# Patient Record
Sex: Male | Born: 1985 | Race: White | Hispanic: No | Marital: Married | Smoking: Never smoker
Health system: Southern US, Community
[De-identification: ages and names within clinical notes are randomized; demographics above are authoritative.]

## PROBLEM LIST (undated history)

## (undated) DIAGNOSIS — T7840XA Allergy, unspecified, initial encounter: Secondary | ICD-10-CM

## (undated) DIAGNOSIS — R011 Cardiac murmur, unspecified: Secondary | ICD-10-CM

## (undated) DIAGNOSIS — J302 Other seasonal allergic rhinitis: Secondary | ICD-10-CM

## (undated) DIAGNOSIS — R079 Chest pain, unspecified: Secondary | ICD-10-CM

## (undated) DIAGNOSIS — L309 Dermatitis, unspecified: Secondary | ICD-10-CM

## (undated) HISTORY — DX: Allergy, unspecified, initial encounter: T78.40XA

## (undated) HISTORY — DX: Other seasonal allergic rhinitis: J30.2

## (undated) HISTORY — DX: Cardiac murmur, unspecified: R01.1

## (undated) HISTORY — DX: Dermatitis, unspecified: L30.9

## (undated) HISTORY — PX: HYDROCELE EXCISION / REPAIR: SUR1145

## (undated) HISTORY — PX: WISDOM TOOTH EXTRACTION: SHX21

## (undated) HISTORY — DX: Chest pain, unspecified: R07.9

## (undated) HISTORY — PX: HAND SURGERY: SHX662

---
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
## 2012-10-15 ENCOUNTER — Encounter (HOSPITAL_COMMUNITY): Payer: Self-pay | Admitting: *Deleted

## 2012-10-15 ENCOUNTER — Emergency Department (HOSPITAL_COMMUNITY): Payer: 59

## 2012-10-15 ENCOUNTER — Emergency Department (HOSPITAL_COMMUNITY)
Admission: EM | Admit: 2012-10-15 | Discharge: 2012-10-15 | Disposition: A | Discharge status: Home / Self Care | Payer: 59 | Attending: Emergency Medicine | Admitting: Emergency Medicine

## 2012-10-15 DIAGNOSIS — R0789 Other chest pain: Secondary | ICD-10-CM

## 2012-10-15 DIAGNOSIS — S99929A Unspecified injury of unspecified foot, initial encounter: Secondary | ICD-10-CM | POA: Insufficient documentation

## 2012-10-15 DIAGNOSIS — S8990XA Unspecified injury of unspecified lower leg, initial encounter: Secondary | ICD-10-CM | POA: Insufficient documentation

## 2012-10-15 DIAGNOSIS — Y9389 Activity, other specified: Secondary | ICD-10-CM | POA: Insufficient documentation

## 2012-10-15 DIAGNOSIS — M79672 Pain in left foot: Secondary | ICD-10-CM

## 2012-10-15 DIAGNOSIS — S298XXA Other specified injuries of thorax, initial encounter: Secondary | ICD-10-CM | POA: Insufficient documentation

## 2012-10-15 DIAGNOSIS — Y92410 Unspecified street and highway as the place of occurrence of the external cause: Secondary | ICD-10-CM | POA: Insufficient documentation

## 2012-10-15 MED ORDER — CYCLOBENZAPRINE HCL 10 MG PO TABS: 10.0000 mg | ORAL_TABLET | Freq: Two times a day (BID) | ORAL | Status: DC | PRN

## 2012-10-15 MED ORDER — NAPROXEN 500 MG PO TABS: 500.0000 mg | ORAL_TABLET | Freq: Two times a day (BID) | ORAL | Status: DC
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
## 2012-10-21 ENCOUNTER — Telehealth: Payer: Self-pay | Admitting: Nurse Practitioner

## 2012-10-21 NOTE — Telephone Encounter (Signed)
APPT MADE

## 2012-10-21 NOTE — Telephone Encounter (Signed)
wtbs tomorrow

## 2012-10-23 ENCOUNTER — Encounter: Payer: Self-pay | Admitting: General Practice

## 2012-10-23 ENCOUNTER — Ambulatory Visit: Payer: 59 | Admitting: General Practice

## 2012-10-23 VITALS — BP 115/70 | HR 80 | Temp 97.1°F | Ht 72.0 in | Wt 167.0 lb

## 2012-10-23 DIAGNOSIS — Z7689 Persons encountering health services in other specified circumstances: Secondary | ICD-10-CM

## 2012-10-23 DIAGNOSIS — Z0189 Encounter for other specified special examinations: Secondary | ICD-10-CM
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
## 2013-04-10 ENCOUNTER — Ambulatory Visit (INDEPENDENT_AMBULATORY_CARE_PROVIDER_SITE_OTHER): Payer: 59 | Admitting: Nurse Practitioner

## 2013-04-10 ENCOUNTER — Encounter: Payer: Self-pay | Admitting: Nurse Practitioner

## 2013-04-10 ENCOUNTER — Telehealth: Payer: Self-pay | Admitting: Family Medicine

## 2013-04-10 VITALS — BP 113/67 | HR 75 | Temp 98.8°F | Ht 71.0 in | Wt 172.0 lb

## 2013-04-10 DIAGNOSIS — R109 Unspecified abdominal pain: Secondary | ICD-10-CM

## 2013-04-10 DIAGNOSIS — N390 Urinary tract infection, site not specified: Secondary | ICD-10-CM

## 2013-04-10 DIAGNOSIS — R42 Dizziness and giddiness: Secondary | ICD-10-CM

## 2013-04-10 LAB — POCT UA - MICROSCOPIC ONLY

## 2013-04-10 LAB — POCT URINALYSIS DIPSTICK

## 2013-04-10 MED ORDER — CIPROFLOXACIN HCL 500 MG PO TABS: 500.0000 mg | ORAL_TABLET | Freq: Two times a day (BID) | ORAL | Status: DC

## 2013-04-10 MED ORDER — MECLIZINE HCL 25 MG PO TABS: 25.0000 mg | ORAL_TABLET | Freq: Three times a day (TID) | ORAL | Status: DC | PRN

## 2013-04-10 NOTE — Patient Instructions (Signed)
Urinary Tract Infection  Urinary tract infections (UTIs) can develop anywhere along your urinary tract. Your urinary tract is your body's drainage system for removing wastes and extra water. Your urinary tract includes two kidneys, two ureters, a bladder, and a urethra. Your kidneys are a pair of bean-shaped organs. Each kidney is about the size of your fist. They are located below your ribs, one on each side of your spine.  CAUSES  Infections are caused by microbes, which are microscopic organisms, including fungi, viruses, and bacteria. These organisms are so small that they can only be seen through a microscope. Bacteria are the microbes that most commonly cause UTIs.  SYMPTOMS   Symptoms of UTIs may vary by age and gender of the patient and by the location of the infection. Symptoms in young women typically include a frequent and intense urge to urinate and a painful, burning feeling in the bladder or urethra during urination. Older women and men are more likely to be tired, shaky, and weak and have muscle aches and abdominal pain. A fever may mean the infection is in your kidneys. Other symptoms of a kidney infection include pain in your back or sides below the ribs, nausea, and vomiting.  DIAGNOSIS  To diagnose a UTI, your caregiver will ask you about your symptoms. Your caregiver also will ask to provide a urine sample. The urine sample will be tested for bacteria and white blood cells. White blood cells are made by your body to help fight infection.  TREATMENT   Typically, UTIs can be treated with medication. Because most UTIs are caused by a bacterial infection, they usually can be treated with the use of antibiotics. The choice of antibiotic and length of treatment depend on your symptoms and the type of bacteria causing your infection.  HOME CARE INSTRUCTIONS   If you were prescribed antibiotics, take them exactly as your caregiver instructs you. Finish the medication even if you feel better after you  have only taken some of the medication.   Drink enough water and fluids to keep your urine clear or pale yellow.   Avoid caffeine, tea, and carbonated beverages. They tend to irritate your bladder.   Empty your bladder often. Avoid holding urine for long periods of time.   Empty your bladder before and after sexual intercourse.   After a bowel movement, women should cleanse from front to back. Use each tissue only once.  SEEK MEDICAL CARE IF:    You have back pain.   You develop a fever.   Your symptoms do not begin to resolve within 3 days.  SEEK IMMEDIATE MEDICAL CARE IF:    You have severe back pain or lower abdominal pain.   You develop chills.   You have nausea or vomiting.   You have continued burning or discomfort with urination.  MAKE SURE YOU:    Understand these instructions.   Will watch your condition.   Will get help right away if you are not doing well or get worse.  Document Released: 04/26/2005 Document Revised: 01/16/2012 Document Reviewed: 08/25/2011  [employer] Patient Information 2014 [employer].

## 2013-04-10 NOTE — Progress Notes (Addendum)
  Subjective:    Patient ID: Nicholas Orozco, male    DOB: 05/11/1986, 27 y.o.   MRN: [id number]  HPI1. Patient has a long history of kidney stones- Has had over 30 in the past several years- He started having back pain 3 days ago- pain not the same as his kidney stone pain- He does have dysuria and frequency and voiding only small amounts at a time. Started takingAZO OTC which has helped some with symptoms. 2. vERTIGO- INTERMITTENT FOR 3-4 MONTHS- worse when he is out in  Public- antivert helps   Review of Systems  Constitutional: Positive for fever (low grade last night) and chills (last night only).  Respiratory: Negative.   Cardiovascular: Negative.   Genitourinary: Positive for dysuria, urgency, frequency and difficulty urinating.       Objective:   Physical Exam  Constitutional: He appears well-developed and well-nourished.  Cardiovascular: Normal rate, regular rhythm and normal heart sounds.   Pulmonary/Chest: Effort normal and breath sounds normal.  Abdominal: Soft. Bowel sounds are normal. There is tenderness (suprapubic).  Genitourinary:  Mild left CVA tnederness  Neurological: He has normal reflexes. No cranial nerve deficit.   BP 113/67  Pulse 75  Temp(Src) 98.8 F (37.1 C) (Oral)  Ht 5\' 11"  (1.803 m)  Wt 172 lb (78.019 kg)  BMI 24 kg/m2  Results for orders placed in visit on 04/10/13  POCT UA - MICROSCOPIC ONLY      Result Value Range   WBC, Ur, HPF, POC occ     RBC, urine, microscopic 2-3     Bacteria, U Microscopic occ     Mucus, UA large     Epithelial cells, urine per micros occ     Crystals, Ur, HPF, POC rare     Casts, Ur, LPF, POC few     Yeast, UA neg    POCT URINALYSIS DIPSTICK      Result Value Range   Color, UA orange     Clarity, UA       Glucose, UA       Bilirubin, UA       Ketones, UA       Spec Grav, UA       Blood, UA       pH, UA       Protein, UA       Urobilinogen, UA       Nitrite, UA       Leukocytes, UA              Assessment & Plan:  1. Abdominal  pain, other specified site  - POCT UA - Microscopic Only - POCT urinalysis dipstick  2. UTI (urinary tract infection) Force fluids - ciprofloxacin (CIPRO) 500 MG tablet; Take 1 tablet (500 mg total) by mouth 2 (two) times daily.  Dispense: 20 tablet; Refill: 0  3. Vertigo Keep diary of episodes - meclizine (ANTIVERT) 25 MG tablet; Take 1 tablet (25 mg total) by mouth 3 (three) times daily as needed.  Dispense: 30 tablet; Refill: 1  Mary-Margaret Daphine Deutscher, FNP

## 2013-04-10 NOTE — Telephone Encounter (Signed)
APPT MADE

## 2013-09-04 ENCOUNTER — Other Ambulatory Visit: Payer: Self-pay | Admitting: Nurse Practitioner

## 2013-09-04 MED ORDER — OSELTAMIVIR PHOSPHATE 75 MG PO CAPS: 75.0000 mg | ORAL_CAPSULE | Freq: Every day | ORAL | Status: DC
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
## 2014-10-29 ENCOUNTER — Telehealth: Payer: Self-pay | Admitting: Nurse Practitioner

## 2014-10-29 NOTE — Telephone Encounter (Signed)
Advised pt would need to be seen by a provider since he hasn't been seen since 2014, pt will CB to schedule appt for CPE.

## 2015-01-11 ENCOUNTER — Encounter: Payer: Self-pay | Admitting: Family Medicine

## 2015-01-11 ENCOUNTER — Ambulatory Visit (INDEPENDENT_AMBULATORY_CARE_PROVIDER_SITE_OTHER): Payer: 59 | Admitting: Family Medicine

## 2015-01-11 VITALS — BP 108/70 | HR 78 | Temp 98.7°F | Ht 71.0 in | Wt 174.4 lb

## 2015-01-11 DIAGNOSIS — I889 Nonspecific lymphadenitis, unspecified: Secondary | ICD-10-CM | POA: Diagnosis not present

## 2015-01-11 LAB — POCT CBC
Granulocyte percent: 61.4 %G (ref 37–80)
HCT, POC: 41.3 % — AB (ref 43.5–53.7)
Hemoglobin: 13.7 g/dL — AB (ref 14.1–18.1)
LYMPH, POC: 2.1 (ref 0.6–3.4)
MCH: 30.2 pg (ref 27–31.2)
MCHC: 33.2 g/dL (ref 31.8–35.4)
MCV: 90.9 fL (ref 80–97)
MPV: 8.2 fL (ref 0–99.8)
PLATELET COUNT, POC: 202 10*3/uL (ref 142–424)
POC Granulocyte: 4.3 (ref 2–6.9)
POC LYMPH %: 29.6 % (ref 10–50)
RBC: 4.54 M/uL — AB (ref 4.69–6.13)
RDW, POC: 12.5 %
WBC: 7 10*3/uL (ref 4.6–10.2)

## 2015-01-11 MED ORDER — AMOXICILLIN-POT CLAVULANATE 875-125 MG PO TABS: 1.0000 | ORAL_TABLET | Freq: Two times a day (BID) | ORAL | Status: DC
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
## 2015-01-12 LAB — LYME AB/WESTERN BLOT REFLEX
LYME DISEASE AB, QUANT, IGM: <0.8 index (ref 0.00–0.79)
Lyme IgG/IgM Ab: <0.91 {ISR} (ref 0.00–0.90)

## 2015-01-13 ENCOUNTER — Telehealth: Payer: Self-pay | Admitting: Family Medicine

## 2015-01-13 ENCOUNTER — Telehealth: Payer: Self-pay | Admitting: *Deleted

## 2015-01-13 NOTE — Telephone Encounter (Signed)
Pt notified of results

## 2015-01-13 NOTE — Progress Notes (Signed)
Patient aware.

## 2015-01-13 NOTE — Telephone Encounter (Signed)
-----   Message from Mechele Claude, MD sent at 01/13/2015  1:09 PM EDT ----- Perrin Maltese,    Your Lyme's Disease lab result is normal. Best regards, Mechele Claude, M.D.

## 2015-01-26 ENCOUNTER — Telehealth: Payer: Self-pay | Admitting: Nurse Practitioner

## 2015-01-26 NOTE — Telephone Encounter (Signed)
appt scheduled with MMM

## 2015-01-28 ENCOUNTER — Ambulatory Visit (INDEPENDENT_AMBULATORY_CARE_PROVIDER_SITE_OTHER): Payer: 59 | Admitting: Nurse Practitioner

## 2015-01-28 ENCOUNTER — Encounter: Payer: Self-pay | Admitting: Nurse Practitioner

## 2015-01-28 VITALS — BP 115/73 | HR 70 | Temp 97.3°F | Ht 71.0 in | Wt 176.0 lb

## 2015-01-28 DIAGNOSIS — R5383 Other fatigue: Secondary | ICD-10-CM

## 2015-01-28 DIAGNOSIS — R591 Generalized enlarged lymph nodes: Secondary | ICD-10-CM | POA: Diagnosis not present

## 2015-01-28 DIAGNOSIS — R42 Dizziness and giddiness: Secondary | ICD-10-CM

## 2015-01-28 NOTE — Patient Instructions (Signed)

## 2015-01-28 NOTE — Progress Notes (Signed)
   Subjective:    Patient ID: Nicholas Orozco, male    DOB: 01-Mar-1986, 29 y.o.   MRN: [id number]  HPI Patient here today with his mom. He is c/o headache- was seen 2 weeks ago by Dr. Livia Snellen with inflammed lymph nodes in neck- they were painful to touch. Dr. Livia Snellen refused to check him for mono-lyme titer was negative- He was put on doxycycline which helped but still has lymph  Node papable in left axillia that is painful to touch. Mom says that he had a dizy spell at work Monday- has had in the past but mainly in the summer time. Dizzy spell lasted 2 hours, became nauseaous and felt like he was going to pass out. He checked his blood pressure 124/72 and blood sugar was 95.     Review of Systems  Constitutional: Negative.   HENT: Negative.   Respiratory: Negative.   Cardiovascular: Negative.   Genitourinary: Negative.   Neurological: Negative.   Psychiatric/Behavioral: Negative.   All other systems reviewed and are negative.      Objective:   Physical Exam  Constitutional: He is oriented to person, place, and time. He appears well-developed and well-nourished.  HENT:  Right Ear: External ear normal.  Left Ear: External ear normal.  Nose: Nose normal.  Mouth/Throat: Oropharynx is clear and moist.  Neck: Normal range of motion.  Cardiovascular: Normal rate, regular rhythm and normal heart sounds.   Pulmonary/Chest: Effort normal and breath sounds normal.  Lymphadenopathy:    He has cervical adenopathy (shotty nodes anterior cervical).  Neurological: He is alert and oriented to person, place, and time.  Skin: Skin is warm and dry.  Psychiatric: He has a normal mood and affect. His behavior is normal. Judgment and thought content normal.    BP 115/73 mmHg  Pulse 70  Temp(Src) 97.3 F (36.3 C) (Oral)  Ht _0  (1.803 m)  Wt 176 lb (79.833 kg)  BMI 24.56 kg/m2       Assessment & Plan:   1. Dizziness   2. Lymphadenopathy   3. Other fatigue    Orders Placed This  Encounter  Procedures  . CMP14+EGFR    Standing Status: Future     Number of Occurrences:      Standing Expiration Date: 01/28/2016  . Anemia Profile B    Standing Status: Future     Number of Occurrences:      Standing Expiration Date: 01/28/2016  . [place] spotted fvr abs pnl(IgG+IgM)    Standing Status: Future     Number of Occurrences:      Standing Expiration Date: 01/28/2016  . Vitamin B12    Standing Status: Future     Number of Occurrences:      Standing Expiration Date: 01/28/2016  . Testosterone,Free and Total    Standing Status: Future     Number of Occurrences:      Standing Expiration Date: 01/28/2016   Keep diary of episodes Force fluids Rest will wait in labs  [place], FNP

## 2015-01-29 ENCOUNTER — Other Ambulatory Visit (INDEPENDENT_AMBULATORY_CARE_PROVIDER_SITE_OTHER): Payer: 59

## 2015-01-29 DIAGNOSIS — R5383 Other fatigue: Secondary | ICD-10-CM

## 2015-01-29 NOTE — Progress Notes (Signed)
Lab only 

## 2015-02-03 ENCOUNTER — Other Ambulatory Visit: Payer: Self-pay | Admitting: Family

## 2015-02-03 LAB — ANEMIA PROFILE B
BASOS: 2 %
Basophils Absolute: 0.1 10*3/uL (ref 0.0–0.2)
EOS (ABSOLUTE): 0.2 10*3/uL (ref 0.0–0.4)
EOS: 3 %
FERRITIN: 156 ng/mL (ref 30–400)
FOLATE: 7.1 ng/mL (ref >3.0)
Hematocrit: 41.1 % (ref 37.5–51.0)
Hemoglobin: 14 g/dL (ref 12.6–17.7)
IMMATURE GRANULOCYTES: 0 %
IRON SATURATION: 34 % (ref 15–55)
IRON: 99 ug/dL (ref 38–169)
Immature Grans (Abs): 0 10*3/uL (ref 0.0–0.1)
Lymphocytes Absolute: 2.3 10*3/uL (ref 0.7–3.1)
Lymphs: 35 %
MCH: 31.3 pg (ref 26.6–33.0)
MCHC: 34.1 g/dL (ref 31.5–35.7)
MCV: 92 fL (ref 79–97)
MONOCYTES: 10 %
MONOS ABS: 0.7 10*3/uL (ref 0.1–0.9)
NEUTROS PCT: 50 %
Neutrophils Absolute: 3.5 10*3/uL (ref 1.4–7.0)
Platelets: 209 10*3/uL (ref 150–379)
RBC: 4.48 x10E6/uL (ref 4.14–5.80)
RDW: 13.5 % (ref 12.3–15.4)
Retic Ct Pct: 1 % (ref 0.6–2.6)
Total Iron Binding Capacity: 290 ug/dL (ref 250–450)
UIBC: 191 ug/dL (ref 111–343)
VITAMIN B 12: 338 pg/mL (ref 211–946)
WBC: 6.8 10*3/uL (ref 3.4–10.8)

## 2015-02-03 LAB — CMP14+EGFR
ALBUMIN: 4.6 g/dL (ref 3.5–5.5)
ALK PHOS: 54 IU/L (ref 39–117)
ALT: 19 IU/L (ref 0–44)
AST: 18 IU/L (ref 0–40)
Albumin/Globulin Ratio: 2.2 (ref 1.1–2.5)
BILIRUBIN TOTAL: 0.7 mg/dL (ref 0.0–1.2)
BUN / CREAT RATIO: 18 (ref 8–19)
BUN: 18 mg/dL (ref 6–20)
CO2: 26 mmol/L (ref 18–29)
CREATININE: 0.98 mg/dL (ref 0.76–1.27)
Calcium: 9.7 mg/dL (ref 8.7–10.2)
Chloride: 100 mmol/L (ref 97–108)
GFR, EST AFRICAN AMERICAN: 121 mL/min/{1.73_m2} (ref >59)
GFR, EST NON AFRICAN AMERICAN: 104 mL/min/{1.73_m2} (ref >59)
GLOBULIN, TOTAL: 2.1 g/dL (ref 1.5–4.5)
Glucose: 92 mg/dL (ref 65–99)
Potassium: 4.1 mmol/L (ref 3.5–5.2)
SODIUM: 142 mmol/L (ref 134–144)
Total Protein: 6.7 g/dL (ref 6.0–8.5)

## 2015-02-03 LAB — ROCKY MTN SPOTTED FVR ABS PNL(IGG+IGM)
RMSF IgG: NEGATIVE
RMSF IgM: 0.84 index (ref 0.00–0.89)

## 2015-02-03 LAB — TESTOSTERONE,FREE AND TOTAL
TESTOSTERONE FREE: 11.2 pg/mL (ref 9.3–26.5)
Testosterone: 333 ng/dL — ABNORMAL LOW (ref 348–1197)

## 2015-02-03 MED ORDER — TESTOSTERONE 50 MG/5GM (1%) TD GEL: 5.0000 g | Freq: Every day | TRANSDERMAL | Status: DC

## 2015-02-05 ENCOUNTER — Telehealth: Payer: Self-pay | Admitting: Nurse Practitioner

## 2015-02-05 NOTE — Telephone Encounter (Signed)
Patient has a new born at home and was reading up on the androgel and it states that he can not have any contact with child for a certain amount of time after using the androgel and he is just unable to do that at this time. He wants to know what else he can do?

## 2015-02-05 NOTE — Telephone Encounter (Signed)
Only option is testosterone shots- but will have to come here to get

## 2015-02-05 NOTE — Telephone Encounter (Signed)
Spoke with patient concerning injections but phone disconnected.  I called back to let him know a script would need to be sent in and an appointment would need to be scheduled for injection at our office if he agrees.

## 2015-02-23 ENCOUNTER — Other Ambulatory Visit: Payer: Self-pay | Admitting: Nurse Practitioner

## 2015-02-23 DIAGNOSIS — E291 Testicular hypofunction: Secondary | ICD-10-CM

## 2015-03-16 ENCOUNTER — Other Ambulatory Visit (INDEPENDENT_AMBULATORY_CARE_PROVIDER_SITE_OTHER): Payer: 59

## 2015-03-16 DIAGNOSIS — E291 Testicular hypofunction: Secondary | ICD-10-CM | POA: Diagnosis not present

## 2015-03-17 LAB — TESTOSTERONE,FREE AND TOTAL
Testosterone, Free: 11.2 pg/mL (ref 9.3–26.5)
Testosterone: 312 ng/dL — ABNORMAL LOW (ref 348–1197)

## 2015-03-31 ENCOUNTER — Other Ambulatory Visit: Payer: Self-pay | Admitting: Family Medicine

## 2015-03-31 MED ORDER — TESTOSTERONE 50 MG/5GM (1%) TD GEL: 5.0000 g | Freq: Every day | TRANSDERMAL | Status: DC

## 2015-03-31 NOTE — Telephone Encounter (Signed)
Look at this please, it looks like it had refills?

## 2015-03-31 NOTE — Telephone Encounter (Signed)
Patient aware rx is ready to be picked up 

## 2015-03-31 NOTE — Telephone Encounter (Signed)
RX ready for pick up 

## 2015-04-02 ENCOUNTER — Telehealth: Payer: Self-pay | Admitting: Family

## 2015-04-02 NOTE — Telephone Encounter (Signed)
Spoke to [facility] pharmacist and they state that insurance doesn't cover the androgel but will cover axiron. Are you ok with changing it and if so what dosage would you like to use? Pt is aware this won't get taken care of until Tuesday probably and is ok with that.

## 2015-04-06 ENCOUNTER — Telehealth: Payer: Self-pay

## 2015-04-06 MED ORDER — TESTOSTERONE 30 MG/ACT TD SOLN: 60.0000 mg | Freq: Every day | TRANSDERMAL | Status: DC

## 2015-04-06 NOTE — Telephone Encounter (Signed)
RX ready for pick up 

## 2015-04-06 NOTE — Telephone Encounter (Signed)
Androgel needs prior authorization  Pharmacy wants to know if you want to change to Androderm or Axironnon?

## 2015-04-06 NOTE — Telephone Encounter (Signed)
Call patient and make sure he wants to do topical testosterone- last time i spoke with him he doid not want to do because of newborn in house.

## 2015-04-06 NOTE — Telephone Encounter (Signed)
Script was placed at front by Baruch Gouty on 04/02/15 and patient was called then per [facility]

## 2015-06-17 ENCOUNTER — Encounter: Payer: Self-pay | Admitting: Family Medicine

## 2015-06-17 ENCOUNTER — Ambulatory Visit (INDEPENDENT_AMBULATORY_CARE_PROVIDER_SITE_OTHER): Payer: 59 | Admitting: Family Medicine

## 2015-06-17 ENCOUNTER — Other Ambulatory Visit: Payer: Self-pay | Admitting: *Deleted

## 2015-06-17 VITALS — BP 138/84 | HR 101 | Temp 98.8°F | Ht 71.0 in | Wt 183.6 lb

## 2015-06-17 DIAGNOSIS — R002 Palpitations: Secondary | ICD-10-CM

## 2015-06-17 DIAGNOSIS — Z23 Encounter for immunization: Secondary | ICD-10-CM | POA: Diagnosis not present

## 2015-06-17 DIAGNOSIS — R079 Chest pain, unspecified: Secondary | ICD-10-CM

## 2015-06-17 NOTE — Progress Notes (Signed)
BP 138/84 mmHg  Pulse 101  Temp(Src) 98.8 F (37.1 C) (Oral)  Ht  (1.803 m)  Wt 183 lb 9.6 oz (83.28 kg)  BMI 25.62 kg/m2   Subjective:    Patient ID: Nicholas Orozco, male    DOB: 1986/01/27, 29 y.o.   MRN: [id number]  HPI: Nicholas Orozco is a 29 y.o. male presenting on 06/17/2015 for Chest Pain and Pressure behind left ear   HPI Chest pain Patient experiences periodic sharp, 9 out of 10 chest pain on the left side of his chest anteriorly. The pain does not radiate anywhere else. The pain usually lasts less than 5 seconds and then goes away. He has it sometimes during activities sometimes during inactivity and last night it woke him up in the middle of night. He denies fevers or chills or shortness of breath. He denies palpitations but he does say that it feels like his heart skips a beat when it happens. He has seen a cardiologist throughout his youth for similar and they never really found a cause for it.  Relevant past medical, surgical, family and social history reviewed and updated as indicated. Interim medical history since our last visit reviewed. Allergies and medications reviewed and updated.  Review of Systems  Constitutional: Negative for fever and chills.  HENT: Negative for congestion, ear discharge and ear pain.   Eyes: Negative for discharge and visual disturbance.  Respiratory: Negative for chest tightness, shortness of breath and wheezing.   Cardiovascular: Positive for chest pain. Negative for palpitations and leg swelling.  Gastrointestinal: Negative for abdominal pain, diarrhea, constipation and abdominal distention.  Endocrine: Negative for cold intolerance, heat intolerance, polydipsia and polyuria.  Genitourinary: Negative for difficulty urinating.  Musculoskeletal: Negative for back pain and gait problem.  Skin: Negative for rash.  Neurological: Negative for syncope, light-headedness and headaches.  All other systems reviewed and are  negative.   Per HPI unless specifically indicated above     Medication List    Notice  As of 06/17/2015  1:32 PM   You have not been prescribed any medications.         Objective:    BP 138/84 mmHg  Pulse 101  Temp(Src) 98.8 F (37.1 C) (Oral)  Ht  (1.803 m)  Wt 183 lb 9.6 oz (83.28 kg)  BMI 25.62 kg/m2  Wt Readings from Last 3 Encounters:  06/17/15 183 lb 9.6 oz (83.28 kg)  01/28/15 176 lb (79.833 kg)  01/11/15 174 lb 6.4 oz (79.107 kg)    Physical Exam  Constitutional: He is oriented to person, place, and time. He appears well-developed and well-nourished. No distress.  Eyes: Conjunctivae and EOM are normal. Pupils are equal, round, and reactive to light. Right eye exhibits no discharge. No scleral icterus.  Neck: Neck supple. No thyromegaly present.  Cardiovascular: Normal rate, regular rhythm, normal heart sounds and intact distal pulses.   No murmur heard. Pulmonary/Chest: Effort normal and breath sounds normal. No respiratory distress. He has no wheezes. He has no rales. He exhibits no tenderness.  Abdominal: He exhibits no distension. There is no tenderness. There is no rebound and no guarding.  Musculoskeletal: Normal range of motion. He exhibits no edema.  Lymphadenopathy:    He has no cervical adenopathy.  Neurological: He is alert and oriented to person, place, and time. Coordination normal.  Skin: Skin is warm and dry. No rash noted. He is not diaphoretic.  Psychiatric: He has a normal mood and affect. His  behavior is normal.  Vitals reviewed.   Results for orders placed or performed in visit on 03/16/15  Testosterone,Free and Total  Result Value Ref Range   Testosterone 312 (L) 348 - 1197 ng/dL   Comment, Testosterone Comment    Testosterone, Free 11.2 9.3 - 26.5 pg/mL   EKG: EKG was normal sinus rhythm with no abnormalities seen.    Assessment & Plan:   Problem List Items Addressed This Visit    None    Visit Diagnoses    Chest pain,  unspecified chest pain type    -  Primary    EKG normal, will stick on Holter monitor and see if we find anything. If not consider referral to cardiology    Relevant Orders    EKG 12-Lead (Completed)    Holter monitor - 24 hour    Encounter for immunization            Follow up plan: Return if symptoms worsen or fail to improve.  Counseling provided for all of the vaccine components Orders Placed This Encounter  Procedures  . Flu Vaccine QUAD 36+ mos IM  . Holter monitor - 24 hour  . EKG 12-Lead    Arville CareJoshua Dettinger, MD [facility] 06/17/2015, 1:32 PM

## 2015-07-20 ENCOUNTER — Telehealth: Payer: Self-pay | Admitting: Family Medicine

## 2015-07-20 NOTE — Telephone Encounter (Signed)
Will forward to ordering provider.   Murtis SinkSam Bradshaw, MD [facility] 07/20/2015, 4:13 PM

## 2015-07-21 NOTE — Telephone Encounter (Signed)
Please review and advise.

## 2015-07-23 ENCOUNTER — Other Ambulatory Visit: Payer: Self-pay | Admitting: Nurse Practitioner

## 2015-07-23 DIAGNOSIS — R7989 Other specified abnormal findings of blood chemistry: Secondary | ICD-10-CM

## 2015-07-23 NOTE — Telephone Encounter (Signed)
I have talked with new doctors about your testosterone- they are saying tat should see endocrinologist before starting testosterone due to your age- If have made referral for you.

## 2015-07-23 NOTE — Telephone Encounter (Signed)
Patient aware and verbalizes understanding. 

## 2015-07-30 ENCOUNTER — Encounter: Payer: Self-pay | Admitting: Podiatry

## 2015-07-30 ENCOUNTER — Ambulatory Visit (INDEPENDENT_AMBULATORY_CARE_PROVIDER_SITE_OTHER): Payer: 59

## 2015-07-30 ENCOUNTER — Ambulatory Visit (INDEPENDENT_AMBULATORY_CARE_PROVIDER_SITE_OTHER): Payer: 59 | Admitting: Podiatry

## 2015-07-30 VITALS — BP 119/84 | HR 73 | Resp 16 | Ht 69.0 in | Wt 180.0 lb

## 2015-07-30 DIAGNOSIS — M722 Plantar fascial fibromatosis: Secondary | ICD-10-CM

## 2015-07-30 DIAGNOSIS — M79672 Pain in left foot: Secondary | ICD-10-CM | POA: Diagnosis not present

## 2015-07-30 DIAGNOSIS — M79671 Pain in right foot: Secondary | ICD-10-CM

## 2015-07-30 MED ORDER — TRIAMCINOLONE ACETONIDE 10 MG/ML IJ SUSP
10.0000 mg | Freq: Once | INTRAMUSCULAR | Status: AC
  Administered 2015-07-30: 10 mg

## 2015-07-30 NOTE — Patient Instructions (Signed)

## 2015-07-30 NOTE — Progress Notes (Signed)
   Subjective:    Patient ID: Nicholas Orozco, male    DOB: [birth year], 29 y.o.   MRN: [id number]  HPI Patient presents with bilateral foot pain; heel & arch; pt stated, "More pain in the right foot"; x6 months.   Review of Systems  HENT: Positive for sinus pressure.   All other systems reviewed and are negative.      Objective:   Physical Exam        Assessment & Plan:

## 2015-07-30 NOTE — Progress Notes (Signed)
Subjective:     Patient ID: Nicholas Orozco, male   DOB: 03-23-1986, 29 y.o.   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 Worse when I get up in the morning or after periods of sitting   Review of Systems  All other systems reviewed and are negative.      Objective:   Physical Exam  Constitutional: He is oriented to person, place, and time.  Cardiovascular: Intact distal pulses.   Musculoskeletal: Normal range of motion.  Neurological: He is oriented to person, place, and time.  Skin: Skin is warm.  Nursing note and vitals reviewed.  neurovascular status intact muscle strength adequate range of motion within normal limits with patient found to have discomfort in the plantar fascial bilateral heels right over left with moderate depression of the arch noted. Patient has good digital perfusion and is well oriented 3     Assessment:     Inflammatory fasciitis heel region right over left at the insertion    Plan:     H&P conditions reviewed with patient and explained nature of deformity. 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 Patient will be seen back when he is returned

## 2015-08-20 ENCOUNTER — Ambulatory Visit (INDEPENDENT_AMBULATORY_CARE_PROVIDER_SITE_OTHER): Payer: 59 | Admitting: Podiatry

## 2015-08-20 ENCOUNTER — Encounter: Payer: Self-pay | Admitting: Podiatry

## 2015-08-20 VITALS — BP 118/78 | HR 64 | Resp 12

## 2015-08-20 DIAGNOSIS — M722 Plantar fascial fibromatosis: Secondary | ICD-10-CM

## 2015-08-20 MED ORDER — TRIAMCINOLONE ACETONIDE 10 MG/ML IJ SUSP
10.0000 mg | Freq: Once | INTRAMUSCULAR | Status: AC
  Administered 2015-08-20: 10 mg

## 2015-08-20 NOTE — Progress Notes (Signed)
Subjective:     Patient ID: Nicholas Orozco, male   DOB: July 20, 1986, 30 y.o.   MRN: [id number]  HPI patient states the heel is doing good but I'm getting pain in my right arch in my left heel doing well and orthotics feel good   Review of Systems     Objective:   Physical Exam Neurovascular status intact with discomfort in the right mid arch area with fluid buildup    Assessment:     Mid arch plantar fasciitis right    Plan:     Today I did a careful injection of the mid arch right 3 mg Kenalog 5 mg Xylocaine and advised on physical therapy and dispensed orthotics

## 2015-08-25 ENCOUNTER — Encounter: Payer: Self-pay | Admitting: "Endocrinology

## 2015-08-25 ENCOUNTER — Ambulatory Visit (INDEPENDENT_AMBULATORY_CARE_PROVIDER_SITE_OTHER): Payer: 59 | Admitting: "Endocrinology

## 2015-08-25 VITALS — BP 115/74 | HR 72 | Ht 71.0 in | Wt 183.0 lb

## 2015-08-25 DIAGNOSIS — N200 Calculus of kidney: Secondary | ICD-10-CM

## 2015-08-25 DIAGNOSIS — E291 Testicular hypofunction: Secondary | ICD-10-CM | POA: Diagnosis not present

## 2015-08-25 NOTE — Progress Notes (Signed)
Subjective:    Patient ID: Nicholas Orozco, male    DOB: 03/23/1986, PCP Mechele Claude, MD   Past Medical History  Diagnosis Date  . Heart murmur     diagnosed as an infant, saw pediatric cardiologist until he was 30 years old   Past Surgical History  Procedure Laterality Date  . Hand surgery    . Hydrocele excision / repair    . Wisdom tooth extraction     Social History   Social History  . Marital Status: Married    Spouse Name: N/A  . Number of Children: N/A  . Years of Education: N/A   Social History Main Topics  . Smoking status: Never Smoker   . Smokeless tobacco: None  . Alcohol Use: No  . Drug Use: No  . Sexual Activity: Not Asked   Other Topics Concern  . None   Social History Narrative   No outpatient encounter prescriptions on file as of 08/25/2015.   No facility-administered encounter medications on file as of 08/25/2015.   ALLERGIES: No Known Allergies VACCINATION STATUS: Immunization History  Administered Date(s) Administered  . Influenza,inj,Quad PF,36+ Mos 06/17/2015    HPI  Nicholas Orozco is 30 year old male with above medical history. He is being seen in consultation for hypothyroidism requested by Dr. Mechele Claude. -He reports that on 2 prior occasions he was found to have low testosterone, the last one from August 6 10 2016 was 312 (normal [phone]). He was offered testosterone replacement is general which she will be used for less than 2 weeks. No testosterone exposure for the last 4 months. He denies any significant history of head injury, testicular injury, testicular surgery, radiation to the genitals, nor chemotherapy. 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 He is close in development was uneventful. He fathers 1 child 82 months old now. He works usually first or second shift. He sleeps at night, obtaining up to 6 hours of uninterrupted sleep. He admits to have suboptimal libido. He denies history of sleep  apnea although his weight is progressively increasing. He denies exposure to heavy opioid medications. He denies use of any bodybuilding androgens or steroids. He uses alcohol socially. He denies smoking nor illicit drug use. -He is interested in appropriate work up and therapy for hypogonadism. -He reports history of recurrent nephrolithiasis which required lithotripsy treatment at least on one occasion.  Review of Systems   Constitutional: +weight gain, + fatigue, no subjective hyperthermia/hypothermia Eyes: no blurry vision, no xerophthalmia ENT: no sore throat, no nodules palpated in throat, no dysphagia/odynophagia, no hoarseness Cardiovascular: no CP/SOB/palpitations/leg swelling Respiratory: no cough/SOB Gastrointestinal: no N/V/D/C Musculoskeletal: no muscle/joint aches Skin: no rashes Neurological: no tremors/numbness/tingling/dizziness Psychiatric: no depression/anxiety  Objective:    BP 115/74 mmHg  Pulse 72  Ht  (1.803 m)  Wt 183 lb (83.008 kg)  BMI 25.53 kg/m2  SpO2 100%  Wt Readings from Last 3 Encounters:  08/25/15 183 lb (83.008 kg)  07/30/15 180 lb (81.647 kg)  06/17/15 183 lb 9.6 oz (83.28 kg)    Physical Exam Constitutional: overweight, in NAD Eyes: PERRLA, EOMI, no exophthalmos ENT: moist mucous membranes, no thyromegaly, no cervical lymphadenopathy Cardiovascular: RRR, No MRG Respiratory: CTA B Gastrointestinal: abdomen soft, NT, ND, BS+ Musculoskeletal: no deformities, strength intact in all 4 Skin: moist, warm, no rashes Genital: His testicles are 25 mL bilaterally, no internal scrotal mass lesion nor inguinal lymphadenopathy. His right testicle is higher than the left testicle and he  states he has always been like that. No history of testicular injury or surgery. Neurological: no tremor with outstretched hands, DTR normal in all 4  CMP ( most recent) CMP     Component Value Date/Time   NA 142 01/29/2015 0906   K 4.1 01/29/2015 0906   CL  100 01/29/2015 0906   CO2 26 01/29/2015 0906   GLUCOSE 92 01/29/2015 0906   BUN 18 01/29/2015 0906   CREATININE 0.98 01/29/2015 0906   CALCIUM 9.7 01/29/2015 0906   PROT 6.7 01/29/2015 0906   ALBUMIN 4.6 01/29/2015 0906   AST 18 01/29/2015 0906   ALT 19 01/29/2015 0906   ALKPHOS 54 01/29/2015 0906   BILITOT 0.7 01/29/2015 0906   GFRNONAA 104 01/29/2015 0906   GFRAA 121 01/29/2015 0906   CBC    Component Value Date/Time   WBC 6.8 01/29/2015 0906   WBC 7.0 01/11/2015 1606   RBC 4.48 01/29/2015 0906   RBC 4.54* 01/11/2015 1606   HGB 13.7* 01/11/2015 1606   HCT 41.1 01/29/2015 0906   HCT 41.3* 01/11/2015 1606   PLT 209 01/29/2015 0906   MCV 92 01/29/2015 0906   MCV 90.9 01/11/2015 1606   MCH 31.3 01/29/2015 0906   MCH 30.2 01/11/2015 1606   MCHC 34.1 01/29/2015 0906   MCHC 33.2 01/11/2015 1606   RDW 13.5 01/29/2015 0906   LYMPHSABS 2.3 01/29/2015 0906   EOSABS 0.2 01/29/2015 0906   BASOSABS 0.1 01/29/2015 0906    03/16/1999 1612 testosterone 312 on the free testosterone 11 point No documented gonadotropins nor prolactin.      Assessment & Plan:   1. Hypogonadism in male -His last testosterone documented in epic system is 312 g per DL which is low normal for his age. However with absence of associated measurement for gonadotropins, this is not sufficient to decide on testosterone replacement.  -I will proceed to obtain a new set of labs including: CBC, BMP, testosterone free and total, SHBG, prolactin, FSH and LH. If testosterone is greater than 250, I will hold off testosterone replacement since he is interested to have more children.  If hypogonadism is determined to be secondary he may need pituitary/sella imaging. He will return in 10 days for reevaluation with his labs. -Given his reported recurrent nephrolithiasis, I have added PTH and calcium level to his upcoming labs.  - I advised patient to maintain close follow up with Mechele Claude, MD for primary care  needs. Follow up plan: Return in about 10 days (around 09/04/2015) for hypogonadism.  Marquis Lunch, MD Phone: [phone]  Fax: [phone]   08/25/2015, 12:38 PM

## 2015-08-27 LAB — BASIC METABOLIC PANEL
BUN: 17 mg/dL (ref 7–25)
CALCIUM: 9.9 mg/dL (ref 8.6–10.3)
CO2: 31 mmol/L (ref 20–31)
CREATININE: 0.99 mg/dL (ref 0.60–1.35)
Chloride: 101 mmol/L (ref 98–110)
GLUCOSE: 90 mg/dL (ref 65–99)
Potassium: 4.1 mmol/L (ref 3.5–5.3)
Sodium: 140 mmol/L (ref 135–146)

## 2015-08-27 LAB — CBC WITH DIFFERENTIAL/PLATELET
Basophils Absolute: 0.1 10*3/uL (ref 0.0–0.1)
Basophils Relative: 1 % (ref 0–1)
EOS ABS: 0.1 10*3/uL (ref 0.0–0.7)
Eosinophils Relative: 2 % (ref 0–5)
HCT: 44.2 % (ref 39.0–52.0)
HEMOGLOBIN: 15 g/dL (ref 13.0–17.0)
LYMPHS ABS: 2.1 10*3/uL (ref 0.7–4.0)
Lymphocytes Relative: 34 % (ref 12–46)
MCH: 30.9 pg (ref 26.0–34.0)
MCHC: 33.9 g/dL (ref 30.0–36.0)
MCV: 90.9 fL (ref 78.0–100.0)
MONOS PCT: 13 % — AB (ref 3–12)
MPV: 11.3 fL (ref 8.6–12.4)
Monocytes Absolute: 0.8 10*3/uL (ref 0.1–1.0)
NEUTROS PCT: 50 % (ref 43–77)
Neutro Abs: 3.2 10*3/uL (ref 1.7–7.7)
PLATELETS: 257 10*3/uL (ref 150–400)
RBC: 4.86 MIL/uL (ref 4.22–5.81)
RDW: 13 % (ref 11.5–15.5)
WBC: 6.3 10*3/uL (ref 4.0–10.5)

## 2015-08-28 LAB — PROLACTIN: Prolactin: 3.6 ng/mL (ref 2.1–17.1)

## 2015-08-28 LAB — FSH/LH
FSH: 5.1 m[IU]/mL (ref 1.4–18.1)
LH: 2.2 m[IU]/mL (ref 1.5–9.3)

## 2015-08-30 ENCOUNTER — Ambulatory Visit: Payer: 59 | Admitting: Podiatry

## 2015-08-30 LAB — TESTOSTERONE, FREE, TOTAL, SHBG
Sex Hormone Binding: 15 nmol/L (ref 10–50)
Testosterone, Free: 75.5 pg/mL (ref 47.0–244.0)
Testosterone-% Free: 2.8 % (ref 1.6–2.9)
Testosterone: 267 ng/dL (ref 250–827)

## 2015-08-30 LAB — PTH, INTACT AND CALCIUM
Calcium: 9.9 mg/dL (ref 8.4–10.5)
PTH: 16 pg/mL (ref 14–64)

## 2015-08-31 LAB — TESTOS,TOTAL,FREE AND SHBG (FEMALE)
Sex Hormone Binding Glob.: 12 nmol/L (ref 10–50)
TESTOSTERONE,TOTAL,LC/MS/MS: 369 ng/dL (ref 250–1100)
Testosterone, Free: 76.8 pg/mL (ref 35.0–155.0)

## 2015-09-06 ENCOUNTER — Ambulatory Visit (INDEPENDENT_AMBULATORY_CARE_PROVIDER_SITE_OTHER): Payer: 59 | Admitting: "Endocrinology

## 2015-09-06 ENCOUNTER — Encounter: Payer: Self-pay | Admitting: "Endocrinology

## 2015-09-06 VITALS — BP 128/83 | HR 74 | Ht 71.0 in | Wt 182.0 lb

## 2015-09-06 DIAGNOSIS — E291 Testicular hypofunction: Secondary | ICD-10-CM | POA: Diagnosis not present

## 2015-09-06 NOTE — Progress Notes (Signed)
Subjective:    Patient ID: Nicholas Orozco, male    DOB: 1986-02-19, PCP Nicholas Fraise, Nicholas Orozco   Past Medical History  Diagnosis Date  . Heart murmur     diagnosed as an infant, saw pediatric cardiologist until he was 30 years old   Past Surgical History  Procedure Laterality Date  . Hand surgery    . Hydrocele excision / repair    . Wisdom tooth extraction     Social History   Social History  . Marital Status: Married    Spouse Name: N/A  . Number of Children: N/A  . Years of Education: N/A   Social History Main Topics  . Smoking status: Never Smoker   . Smokeless tobacco: None  . Alcohol Use: No  . Drug Use: No  . Sexual Activity: Not Asked   Other Topics Concern  . None   Social History Narrative   No outpatient encounter prescriptions on file as of 09/06/2015.   No facility-administered encounter medications on file as of 09/06/2015.   ALLERGIES: No Known Allergies VACCINATION STATUS: Immunization History  Administered Date(s) Administered  . Influenza,inj,Quad PF,36+ Mos 06/17/2015    HPI  Nicholas Orozco is 30 year old male with above medical history. He is here to follow-up after repeat labs for hypogonadism.   -He reports that on 2 prior occasions he was found to have low testosterone, the last one from August 6 10 2016 was 312 (normal [phone]). -His repeat testosterone levels show total testosterone 369  along with normal free testosterone of 76.8 . Normal LH and FSH.  He denies any significant history of head injury, testicular injury, testicular surgery, radiation to the genitals, nor chemotherapy. 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 He is close in development was uneventful. He fathers 1 child 27 months old now. He works usually first or second shift. He sleeps at night, obtaining up to 6 hours of uninterrupted sleep. He admits to have suboptimal libido. He denies history of sleep apnea although his weight is progressively  increasing. He denies exposure to heavy opioid medications. He denies use of any bodybuilding androgens or steroids. He uses alcohol socially. He denies smoking nor illicit drug use.  -He reports history of recurrent nephrolithiasis which required lithotripsy treatment at least on one occasion.  Review of Systems   Constitutional: +weight gain, + fatigue, no subjective hyperthermia/hypothermia Eyes: no blurry vision, no xerophthalmia ENT: no sore throat, no nodules palpated in throat, no dysphagia/odynophagia, no hoarseness Cardiovascular: no CP/SOB/palpitations/leg swelling Respiratory: no cough/SOB Gastrointestinal: no N/V/D/C Musculoskeletal: no muscle/joint aches Skin: no rashes Neurological: no tremors/numbness/tingling/dizziness Psychiatric: no depression/anxiety  Objective:    BP 128/83 mmHg  Pulse 74  Ht 5' 11"  (1.803 m)  Wt 182 lb (82.555 kg)  BMI 25.40 kg/m2  SpO2 99%  Wt Readings from Last 3 Encounters:  09/06/15 182 lb (82.555 kg)  08/25/15 183 lb (83.008 kg)  07/30/15 180 lb (81.647 kg)    Physical Exam Constitutional: overweight, in NAD Eyes: PERRLA, EOMI, no exophthalmos ENT: moist mucous membranes, no thyromegaly, no cervical lymphadenopathy Cardiovascular: RRR, No MRG Respiratory: CTA B Gastrointestinal: abdomen soft, NT, ND, BS+ Musculoskeletal: no deformities, strength intact in all 4 Skin: moist, warm, no rashes Genital: His testicles are 25 mL bilaterally, no internal scrotal mass lesion nor inguinal lymphadenopathy. His right testicle is higher than the left testicle and he states he has always been like that. No history of testicular injury or surgery. Neurological: no  tremor with outstretched hands, DTR normal in all 4  Recent Results (from the past 2160 hour(s))  CBC with Differential/Platelet     Status: Abnormal   Collection Time: 08/25/15  9:42 AM  Result Value Ref Range   WBC 6.3 4.0 - 10.5 K/uL   RBC 4.86 4.22 - 5.81 MIL/uL   Hemoglobin  15.0 13.0 - 17.0 g/dL   HCT 44.2 39.0 - 52.0 %   MCV 90.9 78.0 - 100.0 fL   MCH 30.9 26.0 - 34.0 pg   MCHC 33.9 30.0 - 36.0 g/dL   RDW 13.0 11.5 - 15.5 %   Platelets 257 150 - 400 K/uL   MPV 11.3 8.6 - 12.4 fL   Neutrophils Relative % 50 43 - 77 %   Neutro Abs 3.2 1.7 - 7.7 K/uL   Lymphocytes Relative 34 12 - 46 %   Lymphs Abs 2.1 0.7 - 4.0 K/uL   Monocytes Relative 13 (H) 3 - 12 %   Monocytes Absolute 0.8 0.1 - 1.0 K/uL   Eosinophils Relative 2 0 - 5 %   Eosinophils Absolute 0.1 0.0 - 0.7 K/uL   Basophils Relative 1 0 - 1 %   Basophils Absolute 0.1 0.0 - 0.1 K/uL   Smear Review Criteria for review not met   Basic metabolic panel     Status: None   Collection Time: 08/25/15  9:42 AM  Result Value Ref Range   Sodium 140 135 - 146 mmol/L   Potassium 4.1 3.5 - 5.3 mmol/L   Chloride 101 98 - 110 mmol/L   CO2 31 20 - 31 mmol/L   Glucose, Bld 90 65 - 99 mg/dL   BUN 17 7 - 25 mg/dL   Creat 0.99 0.60 - 1.35 mg/dL   Calcium 9.9 8.6 - 10.3 mg/dL  PTH, intact and calcium     Status: None   Collection Time: 08/25/15  9:42 AM  Result Value Ref Range   PTH 16 14 - 64 pg/mL   Calcium 9.9 8.4 - 10.5 mg/dL    Comment:   Interpretive Guide:                              Intact PTH               Calcium                              ----------               ------- Normal Parathyroid           Normal                   Normal Hypoparathyroidism           Low or Low Normal        Low Hyperparathyroidism      Primary                 Normal or High           High      Secondary               High                     Normal or Low      Tertiary  High                     High Non-Parathyroid   Hypercalcemia              Low or Low Normal        High   Testosterone, Free, Total, SHBG     Status: None   Collection Time: 08/25/15  9:42 AM  Result Value Ref Range   Testosterone 267 250 - 827 ng/dL    Comment: ** Please note change in reference range(s). **   Men with clinically  significant hypogonadal symptoms and testosterone values repeatedly less than approximately 300 ng/dL may benefit from testosterone treatment after adequate risk and benefits counseling.    Sex Hormone Binding 15 10 - 50 nmol/L   Testosterone, Free 75.5 47.0 - 244.0 pg/mL    Comment:   The concentration of free testosterone is derived from a mathematical expression based on constants for the binding of testosterone to sex hormone-binding globulin and albumin.    Testosterone-% Free 2.8 1.6 - 2.9 %  Prolactin     Status: None   Collection Time: 08/25/15  9:42 AM  Result Value Ref Range   Prolactin 3.6 2.1 - 17.1 ng/mL    Comment:      Reference Ranges:                  Male:                       2.1 -  17.1 ng/ml                  Male:   Pregnant          9.7 - 208.5 ng/mL                            Non Pregnant      2.8 -  29.2 ng/mL                            Post Menopausal   1.8 -  20.3 ng/mL                      FSH/LH     Status: None   Collection Time: 08/25/15  9:42 AM  Result Value Ref Range   FSH 5.1 1.4 - 18.1 mIU/mL    Comment: Reference Ranges:          Male:                         1.4 -  18.1 mIU/mL          Male:   Follicular Phase    2.5 -  10.2 mIU/mL                    MidCycle Peak       3.4 -  33.4 mIU/mL                    Luteal Phase        1.5 -   9.1 mIU/mL                    Post Menopausal    23.0 - 116.3 mIU/mL  Pregnant                <   0.3 mIU/mL    LH 2.2 1.5 - 9.3 mIU/mL    Comment: Reference Ranges:          Male:     20 - 70 Years           1.5 -  9.3 mIU/mL                       > 70 Years           3.1 - 34.6 mIU/mL          Male:   Follicular Phase        1.9 - 12.5 mIU/mL                    Midcycle                8.7 - 76.3 mIU/mL                    Luteal Phase            0.5 - 16.9 mIU/mL                    Post Menopausal        15.9 - 54.0 mIU/mL                    Pregnant                    <  1.5  mIU/mL                    Contraceptives          0.7 -  5.6 mIU/mL          Children:                             <  6.0 mIU/mL     Testos,Total,Free and SHBG (Male)     Status: None   Collection Time: 08/25/15  9:42 AM  Result Value Ref Range   Testosterone,Total,LC/MS/MS 369 250 - 1100 ng/dL    Comment: For more information on this test, go to http://education.quest[facility].com/faq/ TotalTestosteroneLCMSMS    Testosterone, Free 76.8 35.0 - 155.0 pg/mL   Sex Hormone Binding Glob. 12 10 - 50 nmol/L     03/16/1999 1612 testosterone 312 on the free testosterone 11 point No documented gonadotropins nor prolactin.      Assessment & Plan:   1. Hypogonadism in male -His repeat testosterone is reported as 369 and 267 on 2 separate occasions along with normal free testosterone and normal gonadotropins LH and FSH.   -  since his testosterone is greater than 250 and he is interested to allow more children,, I will hold off testosterone replacement for now.  - There is no indication for secondary hypogonadism, hence, no need for  pituitary/sella imaging.   -Given his reported recurrent nephrolithiasis, I have  Checked PTH and calcium levels  and these are normal. -He will need repeat total testosterone checked in 6 months with office visit.  - I advised patient to maintain close follow up with Nicholas Fraise, Nicholas Orozco for primary care needs. Follow up plan: Return in about 6 months (around 03/05/2016), or low Testosterone, for follow up with pre-visit labs.  Glade Lloyd, Nicholas Orozco Phone: [phone]  Fax: [phone]   09/06/2015, 4:55 PM

## 2015-10-25 ENCOUNTER — Ambulatory Visit (INDEPENDENT_AMBULATORY_CARE_PROVIDER_SITE_OTHER): Payer: 59 | Admitting: Podiatry

## 2015-10-25 ENCOUNTER — Encounter: Payer: Self-pay | Admitting: Podiatry

## 2015-10-25 DIAGNOSIS — M722 Plantar fascial fibromatosis: Secondary | ICD-10-CM | POA: Diagnosis not present

## 2015-10-25 MED ORDER — TRIAMCINOLONE ACETONIDE 10 MG/ML IJ SUSP
10.0000 mg | Freq: Once | INTRAMUSCULAR | Status: AC
  Administered 2015-10-25: 10 mg

## 2015-10-27 NOTE — Progress Notes (Signed)
Subjective:     Patient ID: Nicholas Orozco, male   DOB: 06-Mar-1986, 30 y.o.   MRN: [id number]  HPI patient states my right heel is hurting me again   Review of Systems     Objective:   Physical Exam Neurovascular status intact with discomfort in the plantar right heel at the insertional point of the tendon into the calcaneus with fluid buildup noted around the medial band    Assessment:     Continue plantar fasciitis right that has reoccurred    Plan:     Since it was doing so well previously I went ahead and just reinjected the plantar fascia 3 mg Kenalog 5 mg Xylocaine and instructed on physical therapy supportive shoes. Patient will be seen back to recheck

## 2016-03-06 ENCOUNTER — Ambulatory Visit: Payer: 59 | Admitting: "Endocrinology

## 2016-04-12 ENCOUNTER — Ambulatory Visit (INDEPENDENT_AMBULATORY_CARE_PROVIDER_SITE_OTHER): Payer: 59 | Admitting: Physician Assistant

## 2016-04-12 ENCOUNTER — Encounter: Payer: Self-pay | Admitting: Physician Assistant

## 2016-04-12 VITALS — BP 121/81 | HR 86 | Temp 98.6°F | Ht 71.0 in | Wt 178.4 lb

## 2016-04-12 DIAGNOSIS — J0100 Acute maxillary sinusitis, unspecified: Secondary | ICD-10-CM | POA: Diagnosis not present

## 2016-04-12 DIAGNOSIS — J309 Allergic rhinitis, unspecified: Secondary | ICD-10-CM | POA: Diagnosis not present

## 2016-04-12 MED ORDER — MONTELUKAST SODIUM 10 MG PO TABS: 10.0000 mg | ORAL_TABLET | Freq: Every day | ORAL | 12 refills | Status: DC

## 2016-04-12 MED ORDER — AMOXICILLIN 500 MG PO CAPS: 1000.0000 mg | ORAL_CAPSULE | Freq: Two times a day (BID) | ORAL | 0 refills | Status: DC

## 2016-04-12 NOTE — Patient Instructions (Signed)
Allergic Rhinitis Allergic rhinitis is when the mucous membranes in the nose respond to allergens. Allergens are particles in the air that cause your body to have an allergic reaction. This causes you to release allergic antibodies. Through a chain of events, these eventually cause you to release histamine into the blood stream. Although meant to protect the body, it is this release of histamine that causes your discomfort, such as frequent sneezing, congestion, and an itchy, runny nose.  CAUSES Seasonal allergic rhinitis (hay fever) is caused by pollen allergens that may come from grasses, trees, and weeds. Year-round allergic rhinitis (perennial allergic rhinitis) is caused by allergens such as house dust mites, pet dander, and mold spores. SYMPTOMS  Nasal stuffiness (congestion).  Itchy, runny nose with sneezing and tearing of the eyes. DIAGNOSIS Your health care provider can help you determine the allergen or allergens that trigger your symptoms. If you and your health care provider are unable to determine the allergen, skin or blood testing may be used. Your health care provider will diagnose your condition after taking your health history and performing a physical exam. Your health care provider may assess you for other related conditions, such as asthma, pink eye, or an ear infection. TREATMENT Allergic rhinitis does not have a cure, but it can be controlled by:  Medicines that block allergy symptoms. These may include allergy shots, nasal sprays, and oral antihistamines.  Avoiding the allergen. Hay fever may often be treated with antihistamines in pill or nasal spray forms. Antihistamines block the effects of histamine. There are over-the-counter medicines that may help with nasal congestion and swelling around the eyes. Check with your health care provider before taking or giving this medicine. If avoiding the allergen or the medicine prescribed do not work, there are many new medicines  your health care provider can prescribe. Stronger medicine may be used if initial measures are ineffective. Desensitizing injections can be used if medicine and avoidance does not work. Desensitization is when a patient is given ongoing shots until the body becomes less sensitive to the allergen. Make sure you follow up with your health care provider if problems continue. HOME CARE INSTRUCTIONS It is not possible to completely avoid allergens, but you can reduce your symptoms by taking steps to limit your exposure to them. It helps to know exactly what you are allergic to so that you can avoid your specific triggers. SEEK MEDICAL CARE IF:  You have a fever.  You develop a cough that does not stop easily (persistent).  You have shortness of breath.  You start wheezing.  Symptoms interfere with normal daily activities.   This information is not intended to replace advice given to you by your health care provider. Make sure you discuss any questions you have with your health care provider.   Document Released: 04/11/2001 Document Revised: 08/07/2014 Document Reviewed: 03/24/2013 Elsevier Interactive Patient Education 2016 [employer] Inc.  

## 2016-04-12 NOTE — Progress Notes (Addendum)
BP 121/81 (BP Location: Right Arm, Patient Position: Sitting, Cuff Size: Large)   Pulse 86   Temp 98.6 F (37 C) (Oral)   Ht 5\' 11"  (1.803 m)   Wt 178 lb 6.4 oz (80.9 kg)   BMI 24.88 kg/m    Subjective:    Patient ID: Nicholas Orozco, male    DOB: [birth year], 30 y.o.   MRN: [id number]  HPI: Nicholas PeyerSteven C Byer is a 30 y.o. male presenting on 04/12/2016 for Cough and Fever (last night ) This patient has a long-standing history of allergic rhinitis and recurrent sinusitis. He states that he has had congestion for several weeks now. He denies any severe fever and chills but did run a low-grade fever last night at just over 100. He isn't taking DayQuil and NyQuil without any relief. He feels too sleepy with Zyrtec, and Claritin keeps him awake if he takes it longer than 5 days. He has not tried Care[employer] adviser[employer]legra. Recommend that he try this. He also has not been using saline very much. He states that the drainage is green and time and mostly low at all times now with some blood tinging. He denies any cough or wheezing at this time.  Relevant past medical, surgical, family and social history reviewed and updated as indicated. Interim medical history since our last visit reviewed. Allergies and medications reviewed and updated. DATA REVIEWED: CHART IN EPIC  Review of Systems  Constitutional: Negative.  Negative for appetite change and fatigue.  HENT: Positive for postnasal drip, rhinorrhea, sneezing and sore throat.   Eyes: Negative.  Negative for pain and visual disturbance.  Respiratory: Positive for cough. Negative for chest tightness, shortness of breath and wheezing.   Cardiovascular: Negative.  Negative for chest pain, palpitations and leg swelling.  Gastrointestinal: Negative.  Negative for abdominal pain, diarrhea, nausea and vomiting.  Endocrine: Negative.   Genitourinary: Negative.   Musculoskeletal: Negative.   Skin: Negative.  Negative for color change and rash.  Neurological: Negative.   Negative for weakness, numbness and headaches.  Psychiatric/Behavioral: Negative.     Per HPI unless specifically indicated above    Medication List       Accurate as of 04/12/16 12:37 PM. Always use your most recent med list.          amoxicillin 500 MG capsule Commonly known as:  AMOXIL Take 2 capsules (1,000 mg total) by mouth 2 (two) times daily.   montelukast 10 MG tablet Commonly known as:  SINGULAIR Take 1 tablet (10 mg total) by mouth at bedtime.          Objective:    BP 121/81 (BP Location: Right Arm, Patient Position: Sitting, Cuff Size: Large)   Pulse 86   Temp 98.6 F (37 C) (Oral)   Ht 5\' 11"  (1.803 m)   Wt 178 lb 6.4 oz (80.9 kg)   BMI 24.88 kg/m   No Known Allergies  Wt Readings from Last 3 Encounters:  04/12/16 178 lb 6.4 oz (80.9 kg)  09/06/15 182 lb (82.6 kg)  08/25/15 183 lb (83 kg)    Physical Exam  Constitutional: He is oriented to person, place, and time. He appears well-developed and well-nourished.  HENT:  Head: Normocephalic and atraumatic.  Right Ear: External ear normal. A middle ear effusion is present.  Left Ear: External ear normal. A middle ear effusion is present.  Nose: Mucosal edema and rhinorrhea present. Right sinus exhibits maxillary sinus tenderness. Left sinus exhibits maxillary sinus tenderness.  Mouth/Throat: Uvula  is midline. Oropharyngeal exudate and posterior oropharyngeal erythema present.  Eyes: Conjunctivae and EOM are normal. Pupils are equal, round, and reactive to light. Right eye exhibits no discharge. Left eye exhibits no discharge.  Neck: Normal range of motion.  Cardiovascular: Normal rate, regular rhythm and normal heart sounds.   Pulmonary/Chest: Effort normal and breath sounds normal. No respiratory distress. He has no wheezes.  Abdominal: Soft.  Lymphadenopathy:    He has cervical adenopathy.       Right cervical: Superficial cervical adenopathy present.       Left cervical: Superficial cervical  adenopathy present.  Neurological: He is alert and oriented to person, place, and time.  Skin: Skin is warm and dry.  Psychiatric: He has a normal mood and affect.  Nursing note and vitals reviewed.       Assessment & Plan:   1. Allergic rhinitis, unspecified allergic rhinitis type Avoidance, saline spray - montelukast (SINGULAIR) 10 MG tablet; Take 1 tablet (10 mg total) by mouth at bedtime.  Dispense: 30 tablet; Refill: 12  2. Acute maxillary sinusitis, recurrence not specified - amoxicillin (AMOXIL) 500 MG capsule; Take 2 capsules (1,000 mg total) by mouth 2 (two) times daily.  Dispense: 40 capsule; Refill: 0   Continue all other maintenance medications as listed above.  Follow up plan: Return if symptoms worsen or fail to improve.  Educational handout given for allergic rhinitis.  Remus Loffler PA-C [facility] [address]  [place], [place] [address] [phone]   04/12/2016, 12:37 PM

## 2017-01-02 ENCOUNTER — Encounter: Payer: Self-pay | Admitting: Physician Assistant

## 2017-01-02 ENCOUNTER — Ambulatory Visit (INDEPENDENT_AMBULATORY_CARE_PROVIDER_SITE_OTHER): Payer: 59 | Admitting: Physician Assistant

## 2017-01-02 VITALS — BP 126/83 | HR 90 | Temp 97.8°F | Ht 71.0 in | Wt 174.8 lb

## 2017-01-02 DIAGNOSIS — J020 Streptococcal pharyngitis: Secondary | ICD-10-CM

## 2017-01-02 DIAGNOSIS — J029 Acute pharyngitis, unspecified: Secondary | ICD-10-CM | POA: Diagnosis not present

## 2017-01-02 LAB — RAPID STREP SCREEN (MED CTR MEBANE ONLY): Strep Gp A Ag, IA W/Reflex: POSITIVE — AB

## 2017-01-02 MED ORDER — AMOXICILLIN 500 MG PO CAPS: 500.0000 mg | ORAL_CAPSULE | Freq: Three times a day (TID) | ORAL | 0 refills | Status: DC

## 2017-01-02 NOTE — Progress Notes (Signed)
BP 126/83   Pulse 90   Temp 97.8 F (36.6 C) (Oral)   Ht 5\' 11"  (1.803 m)   Wt 174 lb 12.8 oz (79.3 kg)   BMI 24.38 kg/m    Subjective:    Patient ID: Nicholas PeyerSteven C Arnott, male    DOB: [birth year], 31 y.o.   MRN: [id number]  HPI: Nicholas PeyerSteven C Barlett is a 31 y.o. male presenting on 01/02/2017 for Generalized Body Aches (sunday); Fever (Sunday night 102); and Sore Throat (no cough, back, groin, calf pains)  This patient has had 3 days severe fever, chills, myalgias.  Complains of sinus headache and postnasal drainage. There is copious drainage at times. Associated sore throat. Pain with swallowing, decreased appetite and headache.  Exposure to strep possible. Son with Fifth's Disease last week.  Relevant past medical, surgical, family and social history reviewed and updated as indicated. Allergies and medications reviewed and updated.  Past Medical History:  Diagnosis Date  . Allergy   . Heart murmur    diagnosed as an infant, saw pediatric cardiologist until he was 31 years old    Past Surgical History:  Procedure Laterality Date  . HAND SURGERY    . HYDROCELE EXCISION / REPAIR    . WISDOM TOOTH EXTRACTION      Review of Systems  Constitutional: Positive for fatigue and fever. Negative for appetite change.  HENT: Positive for sore throat. Negative for sinus pressure.   Eyes: Negative.  Negative for pain and visual disturbance.  Respiratory: Negative for cough, chest tightness, shortness of breath and wheezing.   Cardiovascular: Negative.  Negative for chest pain, palpitations and leg swelling.  Gastrointestinal: Negative.  Negative for abdominal pain, diarrhea, nausea and vomiting.  Endocrine: Negative.   Genitourinary: Negative.   Musculoskeletal: Positive for back pain and myalgias.  Skin: Negative.  Negative for color change and rash.  Neurological: Positive for headaches. Negative for weakness and numbness.  Psychiatric/Behavioral: Negative.     Allergies as of 01/02/2017     No Known Allergies     Medication List       Accurate as of 01/02/17 11:58 AM. Always use your most recent med list.          amoxicillin 500 MG capsule Commonly known as:  AMOXIL Take 1 capsule (500 mg total) by mouth 3 (three) times daily.   montelukast 10 MG tablet Commonly known as:  SINGULAIR Take 1 tablet (10 mg total) by mouth at bedtime.          Objective:    BP 126/83   Pulse 90   Temp 97.8 F (36.6 C) (Oral)   Ht 5\' 11"  (1.803 m)   Wt 174 lb 12.8 oz (79.3 kg)   BMI 24.38 kg/m   No Known Allergies  Physical Exam  Constitutional: He is oriented to person, place, and time. He appears well-developed and well-nourished. He appears distressed.  HENT:  Head: Normocephalic and atraumatic.  Right Ear: Tympanic membrane normal. No drainage. No middle ear effusion.  Left Ear: Tympanic membrane normal. No drainage.  No middle ear effusion.  Nose: Rhinorrhea present. No mucosal edema. Right sinus exhibits no maxillary sinus tenderness. Left sinus exhibits no maxillary sinus tenderness.  Mouth/Throat: Uvula is midline. Oropharyngeal exudate, posterior oropharyngeal edema and posterior oropharyngeal erythema present. No tonsillar abscesses.  Eyes: Conjunctivae and EOM are normal. Pupils are equal, round, and reactive to light. Right eye exhibits no discharge. Left eye exhibits no discharge.  Neck: Normal range of motion.  Cardiovascular: Normal rate, regular rhythm and normal heart sounds.   Pulmonary/Chest: Effort normal and breath sounds normal. No respiratory distress. He has no wheezes.  Abdominal: Soft.  Lymphadenopathy:    He has no cervical adenopathy.  Neurological: He is alert and oriented to person, place, and time.  Skin: Skin is warm and dry.  Psychiatric: He has a normal mood and affect. His behavior is normal.  Nursing note and vitals reviewed.       Assessment & Plan:   1. Sore throat - Rapid strep screen (not at [facility]) - amoxicillin (AMOXIL)  500 MG capsule; Take 1 capsule (500 mg total) by mouth 3 (three) times daily.  Dispense: 30 capsule; Refill: 0  2. Strep pharyngitis - amoxicillin (AMOXIL) 500 MG capsule; Take 1 capsule (500 mg total) by mouth 3 (three) times daily.  Dispense: 30 capsule; Refill: 0   Current Outpatient Prescriptions:  .  amoxicillin (AMOXIL) 500 MG capsule, Take 1 capsule (500 mg total) by mouth 3 (three) times daily., Disp: 30 capsule, Rfl: 0 .  montelukast (SINGULAIR) 10 MG tablet, Take 1 tablet (10 mg total) by mouth at bedtime. (Patient not taking: Reported on 01/02/2017), Disp: 30 tablet, Rfl: 12  Continue all other maintenance medications as listed above.  Follow up plan: Return if symptoms worsen or fail to improve.  Educational handout given for strep pharyngitis  Remus Loffler PA-C [facility] [address]  [place], [place] [address] [phone]   01/02/2017, 11:58 AM

## 2017-01-02 NOTE — Patient Instructions (Signed)

## 2017-01-15 ENCOUNTER — Telehealth: Payer: Self-pay | Admitting: Family Medicine

## 2017-01-15 ENCOUNTER — Other Ambulatory Visit: Payer: Self-pay | Admitting: Physician Assistant

## 2017-01-15 MED ORDER — MAGIC MOUTHWASH: 5.0000 mL | Freq: Four times a day (QID) | ORAL | 0 refills | Status: DC

## 2017-01-15 MED ORDER — AMOXICILLIN-POT CLAVULANATE 875-125 MG PO TABS: 1.0000 | ORAL_TABLET | Freq: Two times a day (BID) | ORAL | 0 refills | Status: DC

## 2017-01-15 NOTE — Telephone Encounter (Signed)
Lawanna KobusAngel, do mind handling this? He saw you recently. My only contact with him (even though I am PCP technically) is over 2 years ago I saw him once  for minor illness.

## 2017-01-15 NOTE — Telephone Encounter (Signed)
[place] [facility] [place] [facility] [place]

## 2017-01-15 NOTE — Progress Notes (Signed)
RX called in .

## 2017-01-15 NOTE — Telephone Encounter (Signed)
Pt finished antibiotic then 2 days later Fri/Sat started again with white patches and scratchy throat, no fever. Wife is scheduled for C-section this Friday

## 2017-01-15 NOTE — Telephone Encounter (Signed)
What symptoms do you have? White spots on throat, he has not had a fever he did have headache   How long have you been sick? About two weeks  Have you been seen for this problem? Yes finished taking antibiotic and still has the white spots  If your provider decides to give you a prescription, [facility]   Patient informed that this information will be sent to the clinical staff for review and that they should receive a follow up call.

## 2017-01-15 NOTE — Telephone Encounter (Signed)
It was not centigrade. It should have said I sent Magic mouthwash and Augmentin

## 2017-06-27 ENCOUNTER — Telehealth: Payer: Self-pay | Admitting: Podiatry

## 2017-06-27 NOTE — Telephone Encounter (Signed)
Pt called and would like us to order another pair of orthotics for him and bill his insurance.

## 2017-07-10 DIAGNOSIS — S134XXA Sprain of ligaments of cervical spine, initial encounter: Secondary | ICD-10-CM | POA: Diagnosis not present

## 2017-07-10 DIAGNOSIS — M546 Pain in thoracic spine: Secondary | ICD-10-CM | POA: Diagnosis not present

## 2017-07-10 DIAGNOSIS — S338XXA Sprain of other parts of lumbar spine and pelvis, initial encounter: Secondary | ICD-10-CM | POA: Diagnosis not present

## 2017-07-12 DIAGNOSIS — S134XXA Sprain of ligaments of cervical spine, initial encounter: Secondary | ICD-10-CM | POA: Diagnosis not present

## 2017-07-12 DIAGNOSIS — M546 Pain in thoracic spine: Secondary | ICD-10-CM | POA: Diagnosis not present

## 2017-07-12 DIAGNOSIS — S338XXA Sprain of other parts of lumbar spine and pelvis, initial encounter: Secondary | ICD-10-CM | POA: Diagnosis not present

## 2017-07-17 DIAGNOSIS — M546 Pain in thoracic spine: Secondary | ICD-10-CM | POA: Diagnosis not present

## 2017-07-17 DIAGNOSIS — S134XXA Sprain of ligaments of cervical spine, initial encounter: Secondary | ICD-10-CM | POA: Diagnosis not present

## 2017-07-17 DIAGNOSIS — S338XXA Sprain of other parts of lumbar spine and pelvis, initial encounter: Secondary | ICD-10-CM | POA: Diagnosis not present

## 2017-07-30 DIAGNOSIS — M722 Plantar fascial fibromatosis: Secondary | ICD-10-CM | POA: Diagnosis not present

## 2017-08-07 ENCOUNTER — Encounter: Payer: Self-pay | Admitting: Family Medicine

## 2017-08-07 ENCOUNTER — Ambulatory Visit (INDEPENDENT_AMBULATORY_CARE_PROVIDER_SITE_OTHER): Payer: 59 | Admitting: Family Medicine

## 2017-08-07 VITALS — BP 121/76 | HR 87 | Temp 99.4°F | Ht 71.0 in | Wt 186.0 lb

## 2017-08-07 DIAGNOSIS — J0100 Acute maxillary sinusitis, unspecified: Secondary | ICD-10-CM | POA: Diagnosis not present

## 2017-08-07 DIAGNOSIS — Z0182 Encounter for allergy testing: Secondary | ICD-10-CM

## 2017-08-07 DIAGNOSIS — J029 Acute pharyngitis, unspecified: Secondary | ICD-10-CM

## 2017-08-07 LAB — CULTURE, GROUP A STREP

## 2017-08-07 LAB — RAPID STREP SCREEN (MED CTR MEBANE ONLY): STREP GP A AG, IA W/REFLEX: NEGATIVE

## 2017-08-07 MED ORDER — MOXIFLOXACIN HCL 400 MG PO TABS: 400.0000 mg | ORAL_TABLET | Freq: Every day | ORAL | 0 refills | Status: DC

## 2017-08-07 MED ORDER — FEXOFENADINE-PSEUDOEPHED ER 180-240 MG PO TB24: 1.0000 | ORAL_TABLET | Freq: Every day | ORAL | 0 refills | Status: DC

## 2017-08-07 NOTE — Progress Notes (Signed)
Chief Complaint  Patient presents with  . Sore Throat    pt here today c/o sore throat that he thinks could be from sinus drainage but wife was seen yesterday and dx'd with Strep    HPI  Patient presents today for Patient presents with upper respiratory congestion.  He is having a great deal of sinus pressure over the medial cheeks bilaterally.  His wife was diagnosed yesterday with strep.  He has young children at home that she cares for in the home.  He has been lightheaded somewhat.  Patient states that he is prone to seasonal allergic rhinitis.  However that seems to be becoming year-round.  Rhinorrhea that is frequently purulent. There is moderate sore throat.  He is having posterior pharyngeal drainage.patient reports coughing frequently as well.  Purulent sputum noted. There is no fever, chills or sweats. The patient denies being short of breath. Onset was 3-5 days ago. Gradually worsening. Tried OTCs without improvement.  PMH: Smoking status noted ROS: Per HPI  Objective: BP 121/76   Pulse 87   Temp 99.4 F (37.4 C) (Oral)   Ht 5\' 11"  (1.803 m)   Wt 186 lb (84.4 kg)   BMI 25.94 kg/m  Gen: NAD, alert, cooperative with exam HEENT: NCAT, Nasal passages swollen, red maxillary sinuses exquisitely tender to percussion TMS clear CV: RRR, good S1/S2, no murmur Resp: Clear to auscultation non-labored Ext: No edema, warm Neuro: Alert and oriented, No gross deficits  Assessment and plan:  1. Sore throat   2. Encounter for allergy testing   3. Acute maxillary sinusitis, recurrence not specified     Meds ordered this encounter  Medications  . moxifloxacin (AVELOX) 400 MG tablet    Sig: Take 1 tablet (400 mg total) by mouth daily.    Dispense:  7 tablet    Refill:  0  . fexofenadine-pseudoephedrine (ALLEGRA-D 24) 180-240 MG 24 hr tablet    Sig: Take 1 tablet by mouth daily with supper.    Dispense:  30 tablet    Refill:  0    Orders Placed This Encounter  Procedures  .  Rapid Strep Screen (Not at [facility])  . Culture, Group A Strep  . Ambulatory referral to Allergy    Referral Priority:   Routine    Referral Type:   Allergy Testing    Referral Reason:   Specialty Services Required    Requested Specialty:   Allergy    Number of Visits Requested:   1    Follow up as needed.  Mechele ClaudeWarren Ruel Dimmick, MD

## 2017-09-07 ENCOUNTER — Telehealth: Payer: Self-pay | Admitting: Family

## 2017-09-07 DIAGNOSIS — J028 Acute pharyngitis due to other specified organisms: Secondary | ICD-10-CM

## 2017-09-07 DIAGNOSIS — B9689 Other specified bacterial agents as the cause of diseases classified elsewhere: Secondary | ICD-10-CM

## 2017-09-07 MED ORDER — BENZONATATE 100 MG PO CAPS: 100.0000 mg | ORAL_CAPSULE | Freq: Three times a day (TID) | ORAL | 0 refills | Status: DC | PRN

## 2017-09-07 MED ORDER — AZITHROMYCIN 250 MG PO TABS: ORAL_TABLET | ORAL | 0 refills | Status: DC

## 2017-09-07 MED ORDER — PREDNISONE 5 MG PO TABS: 5.0000 mg | ORAL_TABLET | ORAL | 0 refills | Status: DC

## 2017-09-07 NOTE — Progress Notes (Signed)

## 2017-09-10 ENCOUNTER — Ambulatory Visit: Payer: 59 | Admitting: Orthotics

## 2017-09-10 DIAGNOSIS — M722 Plantar fascial fibromatosis: Secondary | ICD-10-CM

## 2017-09-10 NOTE — Progress Notes (Signed)
Patient came in today to pick up custom made foot orthotics.  The goals were accomplished and the patient reported no dissatisfaction with said orthotics.  Patient was advised of breakin period and how to report any issues. 

## 2017-09-25 ENCOUNTER — Telehealth: Payer: Self-pay | Admitting: Family

## 2017-09-25 DIAGNOSIS — R0981 Nasal congestion: Secondary | ICD-10-CM

## 2017-09-25 NOTE — Progress Notes (Signed)
Based on what you shared with me it looks like you have a condition that should be evaluated in a face to face office visit.  NOTE: If you entered your credit card information for this eVisit, you will not be charged. You may see a "hold" on your card for the $30 but that hold will drop off and you will not have a charge processed.  If you are having a true medical emergency please call 911.  If you need an urgent face to face visit, [place] has four urgent care centers for your convenience.  If you need care fast and have a high deductible or no insurance consider:   [email or web address] to reserve your spot online an avoid wait times  [facility] [address], [address] [place], [place] [address] 8 am to 8 pm Monday-Friday 10 am to 4 pm Saturday-Sunday *Across the street from [employer]  [facility]  [address] [place], [address] 8 am to 5 pm Monday-Friday * In the [facility]   The following sites will take your  insurance:  . [facility]  [phone] Get Driving Directions Find a Provider at this Location  [address] [place], [place] [address] . 10 am to 8 pm Monday-Friday . 12 pm to 8 pm Saturday-Sunday   . [facility]  [phone] Get Driving Directions Find a Provider at this Location  [address], [address] [place], [place] [address] . 8 am to 8 pm Monday-Friday . 9 am to 6 pm Saturday . 11 am to 6 pm Sunday   . [facility] [place]  [phone] Get Driving Directions  [address] [place], [place] [address] . 8 am to 8 pm Monday-Friday . 8 am to 4 pm Saturday-Sunday   Your e-visit answers were reviewed by a board certified advanced clinical practitioner to complete your personal care plan.  Thank you for using e-Visits. 

## 2017-09-28 ENCOUNTER — Ambulatory Visit (INDEPENDENT_AMBULATORY_CARE_PROVIDER_SITE_OTHER): Payer: 59 | Admitting: Family Medicine

## 2017-09-28 VITALS — BP 134/85 | HR 78 | Temp 97.9°F | Ht 71.0 in | Wt 177.0 lb

## 2017-09-28 DIAGNOSIS — J329 Chronic sinusitis, unspecified: Secondary | ICD-10-CM | POA: Diagnosis not present

## 2017-09-28 MED ORDER — AMOXICILLIN-POT CLAVULANATE 875-125 MG PO TABS: 1.0000 | ORAL_TABLET | Freq: Two times a day (BID) | ORAL | 0 refills | Status: DC

## 2017-09-28 NOTE — Progress Notes (Signed)
Subjective: CC: Chronic sinusitis PCP: Mechele ClaudeStacks, Warren, MD MVH:QIONGEHPI:Makhi C Leonides CaveHolcomb is a 32 y.o. male presenting to clinic today for:  1.  Chronic sinusitis Patient reports that he has been sick with a sinus infection for over 6 weeks now.  He is undergone 2 rounds of antibiotics with no resolution in symptoms.  He is currently using sinus rinses and over-the-counter antihistamines for symptoms.  He is scheduled to see an allergist soon to further evaluate.  He notes he has been evaluated by ear nose and throat in the past and they were unable to find why he continues to have sinus infections.  He reports a fever to 102 F on Sunday evening.  He denies chills, nausea, vomiting, dizziness.  No overt purulence from nares.  He is failed Flonase and other nasal sprays.  He is using nasal saline right now but he notes that symptoms are not improving.   ROS: Per HPI  No Known Allergies Past Medical History:  Diagnosis Date  . Allergy   . Heart murmur    diagnosed as an infant, saw pediatric cardiologist until he was 32 years old    Current Outpatient Medications:  .  fexofenadine-pseudoephedrine (ALLEGRA-D 24) 180-240 MG 24 hr tablet, Take 1 tablet by mouth daily with supper. (Patient not taking: Reported on 09/28/2017), Disp: 30 tablet, Rfl: 0 Social History   Socioeconomic History  . Marital status: Married    Spouse name: Not on file  . Number of children: Not on file  . Years of education: Not on file  . Highest education level: Not on file  Social Needs  . Financial resource strain: Not on file  . Food insecurity - worry: Not on file  . Food insecurity - inability: Not on file  . Transportation needs - medical: Not on file  . Transportation needs - non-medical: Not on file  Occupational History  . Not on file  Tobacco Use  . Smoking status: Never Smoker  . Smokeless tobacco: Never Used  Substance and Sexual Activity  . Alcohol use: No  . Drug use: No  . Sexual activity: Not on  file  Other Topics Concern  . Not on file  Social History Narrative  . Not on file   Family History  Problem Relation Age of Onset  . Autoimmune disease Sister     Objective: Office vital signs reviewed. BP 134/85   Pulse 78   Temp 97.9 F (36.6 C) (Oral)   Ht 5\' 11"  (1.803 m)   Wt 177 lb (80.3 kg)   BMI 24.69 kg/m   Physical Examination:  General: Awake, alert, well nourished, No acute distress HEENT: Maxillary tenderness to palpation.    Neck: No masses palpated. No lymphadenopathy    Ears: Tympanic membranes intact, normal light reflex, no erythema, no bulging    Eyes: PERRLA, extraocular membranes intact, sclera white    Nose: nasal turbinates moist, turbinates are edematous and erythematous.  Minimal septal deviation appreciated.  Clear nasal discharge    Throat: moist mucus membranes, no erythema, no tonsillar exudate.  Airway is patent Cardio: regular rate and rhythm, S1S2 heard, no murmurs appreciated Pulm: clear to auscultation bilaterally, no wheezes, rhonchi or rales; normal work of breathing on room air  Assessment/ Plan: 32 y.o. male   1. Recurrent sinusitis Given duration of symptoms and reported fever to 102 F earlier this week, will treat empirically with Augmentin.  He has a appointment with allergy soon.  Home care instructions reviewed.  Reasons for return or emergent evaluation reviewed with patient.  Voiced understanding will follow-up as needed.  Meds ordered this encounter  Medications  . amoxicillin-clavulanate (AUGMENTIN) 875-125 MG tablet    Sig: Take 1 tablet by mouth 2 (two) times daily.    Dispense:  20 tablet    Refill:  0     Lamya Lausch Hulen Skains, DO [facility] [phone]

## 2017-09-28 NOTE — Patient Instructions (Signed)

## 2017-10-30 ENCOUNTER — Ambulatory Visit (INDEPENDENT_AMBULATORY_CARE_PROVIDER_SITE_OTHER): Payer: 59 | Admitting: Allergy & Immunology

## 2017-10-30 ENCOUNTER — Encounter: Payer: Self-pay | Admitting: Allergy & Immunology

## 2017-10-30 VITALS — BP 118/74 | HR 82 | Temp 97.8°F | Resp 16 | Ht 69.88 in | Wt 181.8 lb

## 2017-10-30 DIAGNOSIS — J302 Other seasonal allergic rhinitis: Secondary | ICD-10-CM

## 2017-10-30 DIAGNOSIS — J3089 Other allergic rhinitis: Secondary | ICD-10-CM

## 2017-10-30 MED ORDER — AZELASTINE-FLUTICASONE 137-50 MCG/ACT NA SUSP: 2.0000 | Freq: Every day | NASAL | 5 refills | Status: DC | PRN

## 2017-10-30 MED ORDER — AZELASTINE HCL 0.15 % NA SOLN: 2.0000 | Freq: Two times a day (BID) | NASAL | 5 refills | Status: DC

## 2017-10-30 MED ORDER — FLUTICASONE PROPIONATE 50 MCG/ACT NA SUSP: 2.0000 | Freq: Two times a day (BID) | NASAL | 5 refills | Status: DC

## 2017-10-30 NOTE — Progress Notes (Signed)
NEW PATIENT  Date of Service/Encounter:  10/30/17  Referring provider: Mechele Claude, MD   Assessment:   Seasonal and perennial allergic rhinitis (outdoor molds, dust mites and cockroach) - with lackluster positive control today  Eczema - limited to the face  Recurrent sinusitis - likely related to uncontrolled S/PAR  Plan/Recommendations:   1. Seasonal and perennial allergic rhinitis - Testing today showed: outdoor molds, dust mites and cockroach - Avoidance measures provided. - We will get blood to see if anything pops up on that way.  - Continue with: Allegra (fexofenadine) 180mg  table once daily - Start taking: Dymista (fluticasone/azelastine) two sprays per nostril 1-2 times daily as needed - You can use an extra dose of the antihistamine, if needed, for breakthrough symptoms.  - Consider nasal saline rinses 1-2 times daily to remove allergens from the nasal cavities as well as help with mucous clearance (this is especially helpful to do before the nasal sprays are given) - Consider allergy shots as a means of long-term control. - Allergy shots "re-train" and "reset" the immune system to ignore environmental allergens and decrease the resulting immune response to those allergens (sneezing, itchy watery eyes, runny nose, nasal congestion, etc).   - Allergy shots improve symptoms in 75-85% of patients.  - We can discuss more at the next appointment if the medications are not working for you. - We may consider a sinus CT in the future if there is no improvement. - Hopefully the blood testing will be enlightening with regards to allergic sensitization. - I did defer on an immune workup since the infections were isolated to the sinuses only.   2. Return in about 3 months (around 01/29/2018).  Subjective:   Nicholas Orozco is a 32 y.o. male presenting today for evaluation of  Chief Complaint  Patient presents with  . Allergic Rhinitis   . Nasal Congestion    Nicholas Orozco has a history of the following: Patient Active Problem List   Diagnosis Date Noted  . Hypogonadism in male 08/25/2015  . Nephrolithiasis 08/25/2015    History obtained from: chart review and the patient.  Nicholas Orozco was referred by Mechele Claude, MD.     Nicholas Orozco is a 32 y.o. male presenting for an allergy evaluation. Review of his notes shows that he has a history of recurrent sinusitis. He has had worsening symptoms as he gets older. He reports that his sinus issues started in his early 18s. For the last three years, every winter he gets a sinus infection that he cannot fight. He went through 4 antibiotics and prednisone since January 2019. He did have influenza for one week as well. Spring is rough on him but summer is not too terrible.   He has been on all of the antihistamines. He is now on Allegra D which he has been on for two months. This does not keep him away, but the other antihistamines keep him wired. He does use Benadryl occasionally, but this wires him as well.  He uses a Netti pot and saline spray which he uses throughout the day. He has not tried the saline gel. He does have Afrin which is using every day.  Shailen did go to ENT and apparently this evaluation was normal. He does report a raw nose at the tip with some intermittent bleeding. He does endorse tooth pain during the worst symptoms. He has never had a sinus CT scan to his knowledge. He did have a slightly deviated  septum but otherwise work up was normal. ENT was two years ago and he cannot remember the name of the physician whom he saw.   Otherwise, there is no history of other atopic diseases, including asthma, drug allergies, food allergies, stinging insect allergies, or urticaria. There is no significant infectious history aside from recurrent sinusitis. Vaccinations are up to date.    Past Medical History: Patient Active Problem List   Diagnosis Date Noted  . Hypogonadism in male 08/25/2015  .  Nephrolithiasis 08/25/2015    Medication List:  Allergies as of 10/30/2017   No Known Allergies     Medication List        Accurate as of 10/30/17 12:33 PM. Always use your most recent med list.          amoxicillin-clavulanate 875-125 MG tablet Commonly known as:  AUGMENTIN Take 1 tablet by mouth 2 (two) times daily.   Azelastine HCl 0.15 % Soln Place 2 sprays into both nostrils 2 (two) times daily.   fexofenadine-pseudoephedrine 180-240 MG 24 hr tablet Commonly known as:  ALLEGRA-D 24 Take 1 tablet by mouth daily with supper.   fluticasone 50 MCG/ACT nasal spray Commonly known as:  FLONASE Place 2 sprays into both nostrils 2 (two) times daily.       Birth History: non-contributory.   Developmental History: non-contributory.   Past Surgical History: Past Surgical History:  Procedure Laterality Date  . HAND SURGERY    . HYDROCELE EXCISION / REPAIR    . WISDOM TOOTH EXTRACTION       Family History: Family History  Problem Relation Age of Onset  . Autoimmune disease Sister   . Asthma Sister   . Asthma Sister   . Allergic rhinitis Neg Hx   . Angioedema Neg Hx   . Eczema Neg Hx   . Immunodeficiency Neg Hx   . Urticaria Neg Hx      Social History: Nicholas Orozco lives at home with his wife and two kids. There are two dogs that he has been around for his entire life. They live on a 20 acre property with 12 acres of it wooded. 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 He has a 31yo and a 30mo. He grew up and lives now in [place]. He lives in a house that is 31 years old. There is laminate flooring in the main living areas. They have electric heating and central cooling. There are dust mite coverings on the bedding, but not the pillows. There is no tobacco exposure.     Review of Systems: a 14-point review of systems is pertinent for what is mentioned in HPI.  Otherwise,  all other systems were negative. Constitutional: negative other than that listed in the HPI Eyes: negative other than that listed in the HPI Ears, nose, mouth, throat, and face: negative other than that listed in the HPI Respiratory: negative other than that listed in the HPI Cardiovascular: negative other than that listed in the HPI Gastrointestinal: negative other than that listed in the HPI Genitourinary: negative other than that listed in the HPI Integument: negative other than that listed in the HPI Hematologic: negative other than that listed in the HPI Musculoskeletal: negative other than that listed in the HPI Neurological: negative other than that listed in the HPI Allergy/Immunologic: negative other than that listed in the HPI    Objective:   Blood  pressure 118/74, pulse 82, temperature 97.8 F (36.6 C), temperature source Oral, resp. rate 16, height 5' 9.88" (1.775 m), weight 181 lb 12.8 oz (82.5 kg), SpO2 97 %. Body mass index is 26.17 kg/m.   Physical Exam:  General: Alert, interactive, in no acute distress. Pleasant male.  Eyes: No conjunctival injection bilaterally, no discharge on the right, no discharge on the left, no Horner-Trantas dots present and allergic shiners present bilaterally. PERRL bilaterally. EOMI without pain. No photophobia.  Ears: Right TM pearly gray with normal light reflex, Left TM pearly gray with normal light reflex, Right TM intact without perforation and Left TM intact without perforation.  Nose/Throat: Septum slighly deviated. Nasal mucosa is raw and irritated and External nose within normal limits. Turbinates markedly edematous and pale with clear discharge. Posterior oropharynx erythematous with cobblestoning in the posterior oropharynx. Tonsils 2+ without exudates.  Tongue without thrush. Neck: Supple without thyromegaly. Trachea midline. Adenopathy: no enlarged lymph nodes appreciated in the anterior cervical, occipital, axillary,  epitrochlear, inguinal, or popliteal regions. Lungs: Clear to auscultation without wheezing, rhonchi or rales. No increased work of breathing. CV: Normal S1/S2. No murmurs. Capillary refill <2 seconds.  Abdomen: Nondistended, nontender. No guarding or rebound tenderness. Bowel sounds present in all fields and hypoactive  Skin: Warm and dry, without lesions or rashes. Extremities:  No clubbing, cyanosis or edema. Neuro:   Grossly intact. No focal deficits appreciated. Responsive to questions.  Diagnostic studies:     Allergy Studies:   Indoor/Outdoor Percutaneous Adult Environmental Panel: negative to the entire panel with adequate controls.  Indoor/Outdoor Intradermal testing: 3+ to mold mix #4, 1+ to dust mite, 1+ to cockroach. Otherwise negative to the remainder of the panel.    Allergy testing results were read and interpreted by myself, documented by clinical staff.     Malachi BondsJoel Kyndle Schlender, MD [facility]

## 2017-10-30 NOTE — Patient Instructions (Addendum)
1. Seasonal and perennial allergic rhinitis - Testing today showed: outdoor molds, dust mites and cockroach - Avoidance measures provided. - We will get blood to see if anything pops up on that way.  - Continue with: Allegra (fexofenadine) 180mg  table once daily - Start taking: Dymista (fluticasone/azelastine) two sprays per nostril 1-2 times daily as needed - You can use an extra dose of the antihistamine, if needed, for breakthrough symptoms.  - Consider nasal saline rinses 1-2 times daily to remove allergens from the nasal cavities as well as help with mucous clearance (this is especially helpful to do before the nasal sprays are given) - Consider allergy shots as a means of long-term control. - Allergy shots "re-train" and "reset" the immune system to ignore environmental allergens and decrease the resulting immune response to those allergens (sneezing, itchy watery eyes, runny nose, nasal congestion, etc).   - Allergy shots improve symptoms in 75-85% of patients.  - We can discuss more at the next appointment if the medications are not working for you.  2. Return in about 3 months (around 01/29/2018).    Please inform us of any Emergency Department visits, hospitalizations, or changes in symptoms. Call us before going to the ED for breathing or allergy symptoms since we might be able to fit you in for a sick visit. Feel free to contact us anytime with any questions, problems, or concerns.  It was a pleasure to meet you today!  Websites that have reliable patient information: 1. American Academy of Asthma, Allergy, and Immunology: www.aaaai.org 2. Food Allergy Research and Education (FARE): foodallergy.org 3. Mothers of Asthmatics: http://www.asthmacommunitynetwork.org 4. American College of Allergy, Asthma, and Immunology: www.acaai.org   Control of Cockroach Allergen  Cockroach allergen has been identified as an important cause of acute attacks of asthma, especially in urban  settings.  There are fifty-five species of cockroach that exist in the [place], however only three, the [place], [place] species produce allergen that can affect patients with Asthma.  Allergens can be obtained from fecal particles, egg casings and secretions from cockroaches.    1. Remove food sources. 2. Reduce access to water. 3. Seal access and entry points. 4. Spray runways with 0.5-1% Diazinon or Chlorpyrifos 5. Blow boric acid power under stoves and refrigerator. 6. Place bait stations (hydramethylnon) at feeding sites.  Control of House Dust Mite Allergen    House dust mites play a major role in allergic asthma and rhinitis.  They occur in environments with high humidity wherever human skin, the food for dust mites is found. High levels have been detected in dust obtained from mattresses, pillows, carpets, upholstered furniture, bed covers, clothes and soft toys.  The principal allergen of the house dust mite is found in its feces.  A gram of dust may contain 1,000 mites and 250,000 fecal particles.  Mite antigen is easily measured in the air during house cleaning activities.    1. Encase mattresses, including the box spring, and pillow, in an air tight cover.  Seal the zipper end of the encased mattresses with wide adhesive tape. 2. Wash the bedding in water of 130 degrees Farenheit weekly.  Avoid cotton comforters/quilts and flannel bedding: the most ideal bed covering is the dacron comforter. 3. Remove all upholstered furniture from the bedroom. 4. Remove carpets, carpet padding, rugs, and non-washable window drapes from the bedroom.  Wash drapes weekly or use plastic window coverings. 5. Remove all non-washable stuffed toys from the bedroom.  Wash stuffed toys  weekly. 6. Have the room cleaned frequently with a vacuum cleaner and a damp dust-mop.  The patient should not be in a room which is being cleaned and should wait 1 hour after cleaning before going into the  room. 7. Close and seal all heating outlets in the bedroom.  Otherwise, the room will become filled with dust-laden air.  An electric heater can be used to heat the room. 8. Reduce indoor humidity to less than 50%.  Do not use a humidifier.  Control of Mold Allergen   Mold and fungi can grow on a variety of surfaces provided certain temperature and moisture conditions exist.  Outdoor molds grow on plants, decaying vegetation and soil.  The major outdoor mold, Alternaria and Cladosporium, are found in very high numbers during hot and dry conditions.  Generally, a late Summer - Fall peak is seen for common outdoor fungal spores.  Rain will temporarily lower outdoor mold spore count, but counts rise rapidly when the rainy period ends.  The most important indoor molds are Aspergillus and Penicillium.  Dark, humid and poorly ventilated basements are ideal sites for mold growth.  The next most common sites of mold growth are the bathroom and the kitchen.  Indoor (Perennial) Mold Control   Positive indoor molds via skin testing: Fusarium, Aureobasidium (Pullulara) and Rhizopus  1. Maintain humidity below 50%. 2. Clean washable surfaces with 5% bleach solution. 3. Remove sources e.g. contaminated carpets.    Allergy Shots   Allergies are the result of a chain reaction that starts in the immune system. Your immune system controls how your body defends itself. For instance, if you have an allergy to pollen, your immune system identifies pollen as an invader or allergen. Your immune system overreacts by producing antibodies called Immunoglobulin E (IgE). These antibodies travel to cells that release chemicals, causing an allergic reaction.  The concept behind allergy immunotherapy, whether it is received in the form of shots or tablets, is that the immune system can be desensitized to specific allergens that trigger allergy symptoms. Although it requires time and patience, the payback can be long-term  relief.  How Do Allergy Shots Work?  Allergy shots work much like a vaccine. Your body responds to injected amounts of a particular allergen given in increasing doses, eventually developing a resistance and tolerance to it. Allergy shots can lead to decreased, minimal or no allergy symptoms.  There generally are two phases: build-up and maintenance. Build-up often ranges from three to six months and involves receiving injections with increasing amounts of the allergens. The shots are typically given once or twice a week, though more rapid build-up schedules are sometimes used.  The maintenance phase begins when the most effective dose is reached. This dose is different for each person, depending on how allergic you are and your response to the build-up injections. Once the maintenance dose is reached, there are longer periods between injections, typically two to four weeks.  Occasionally doctors give cortisone-type shots that can temporarily reduce allergy symptoms. These types of shots are different and should not be confused with allergy immunotherapy shots.  Who Can Be Treated with Allergy Shots?  Allergy shots may be a good treatment approach for people with allergic rhinitis (hay fever), allergic asthma, conjunctivitis (eye allergy) or stinging insect allergy.   Before deciding to begin allergy shots, you should consider:  . The length of allergy season and the severity of your symptoms . Whether medications and/or changes to your environment can control  your symptoms . Your desire to avoid long-term medication use . Time: allergy immunotherapy requires a major time commitment . Cost: may vary depending on your insurance coverage  Allergy shots for children age 57 and older are effective and often well tolerated. They might prevent the onset of new allergen sensitivities or the progression to asthma.  Allergy shots are not started on patients who are pregnant but can be continued on  patients who become pregnant while receiving them. In some patients with other medical conditions or who take certain common medications, allergy shots may be of risk. It is important to mention other medications you talk to your allergist.   When Will I Feel Better?  Some may experience decreased allergy symptoms during the build-up phase. For others, it may take as long as 12 months on the maintenance dose. If there is no improvement after a year of maintenance, your allergist will discuss other treatment options with you.  If you aren't responding to allergy shots, it may be because there is not enough dose of the allergen in your vaccine or there are missing allergens that were not identified during your allergy testing. Other reasons could be that there are high levels of the allergen in your environment or major exposure to non-allergic triggers like tobacco smoke.  What Is the Length of Treatment?  Once the maintenance dose is reached, allergy shots are generally continued for three to five years. The decision to stop should be discussed with your allergist at that time. Some people may experience a permanent reduction of allergy symptoms. Others may relapse and a longer course of allergy shots can be considered.  What Are the Possible Reactions?  The two types of adverse reactions that can occur with allergy shots are local and systemic. Common local reactions include very mild redness and swelling at the injection site, which can happen immediately or several hours after. A systemic reaction, which is less common, affects the entire body or a particular body system. They are usually mild and typically respond quickly to medications. Signs include increased allergy symptoms such as sneezing, a stuffy nose or hives.  Rarely, a serious systemic reaction called anaphylaxis can develop. Symptoms include swelling in the throat, wheezing, a feeling of tightness in the chest, nausea or dizziness.  Most serious systemic reactions develop within 30 minutes of allergy shots. This is why it is strongly recommended you wait in your doctor's office for 30 minutes after your injections. Your allergist is trained to watch for reactions, and his or her staff is trained and equipped with the proper medications to identify and treat them.  Who Should Administer Allergy Shots?  The preferred location for receiving shots is your prescribing allergist's office. Injections can sometimes be given at another facility where the physician and staff are trained to recognize and treat reactions, and have received instructions by your prescribing allergist.

## 2017-11-06 LAB — IGE+ALLERGENS ZONE 2(30)
Alternaria Alternata IgE: <0.1 kU/L
Amer Sycamore IgE Qn: <0.1 kU/L
Aspergillus Fumigatus IgE: <0.1 kU/L
Bermuda Grass IgE: <0.1 kU/L
Cat Dander IgE: <0.1 kU/L
Cedar, Mountain IgE: <0.1 kU/L
Cladosporium Herbarum IgE: <0.1 kU/L
D Farinae IgE: <0.1 kU/L
IGE (IMMUNOGLOBULIN E), SERUM: 22 [IU]/mL (ref 6–495)
Penicillium Chrysogen IgE: <0.1 kU/L
Ragweed, Short IgE: <0.1 kU/L
Stemphylium Herbarum IgE: <0.1 kU/L
Sweet gum IgE RAST Ql: <0.1 kU/L
Timothy Grass IgE: <0.1 kU/L
White Mulberry IgE: <0.1 kU/L

## 2017-11-13 DIAGNOSIS — J343 Hypertrophy of nasal turbinates: Secondary | ICD-10-CM | POA: Insufficient documentation

## 2017-11-13 DIAGNOSIS — J324 Chronic pansinusitis: Secondary | ICD-10-CM | POA: Insufficient documentation

## 2017-11-13 DIAGNOSIS — J342 Deviated nasal septum: Secondary | ICD-10-CM | POA: Insufficient documentation

## 2017-11-13 DIAGNOSIS — J310 Chronic rhinitis: Secondary | ICD-10-CM | POA: Insufficient documentation

## 2019-06-06 ENCOUNTER — Telehealth: Payer: Self-pay | Admitting: Podiatry

## 2019-06-06 NOTE — Telephone Encounter (Signed)
Pt left message asking if he could get another pair of orthotics ordered like the ones he got in February  2019.

## 2019-06-06 NOTE — Telephone Encounter (Signed)
After reviewing chart pt has not seen doctor since 2017 so I have scheduled him to see Dr Paulla Dolly and Liliane Channel if needed.

## 2019-06-12 ENCOUNTER — Ambulatory Visit (INDEPENDENT_AMBULATORY_CARE_PROVIDER_SITE_OTHER): Payer: 59 | Admitting: Orthotics

## 2019-06-12 ENCOUNTER — Ambulatory Visit (INDEPENDENT_AMBULATORY_CARE_PROVIDER_SITE_OTHER): Payer: 59

## 2019-06-12 ENCOUNTER — Encounter: Payer: Self-pay | Admitting: Podiatry

## 2019-06-12 ENCOUNTER — Ambulatory Visit (INDEPENDENT_AMBULATORY_CARE_PROVIDER_SITE_OTHER): Payer: 59 | Admitting: Podiatry

## 2019-06-12 ENCOUNTER — Other Ambulatory Visit: Payer: Self-pay

## 2019-06-12 VITALS — BP 122/84 | HR 66 | Resp 16

## 2019-06-12 DIAGNOSIS — M722 Plantar fascial fibromatosis: Secondary | ICD-10-CM

## 2019-06-12 NOTE — Progress Notes (Signed)
Repeat same order

## 2019-06-12 NOTE — Progress Notes (Signed)
Subjective:   Patient ID: Nicholas Orozco, male   DOB: 33 y.o.   MRN: [id number]   HPI Patient states he needs new orthotics and has had history of orthotics and states that his heels overall feel good with occasional discomfort   Review of Systems  All other systems reviewed and are negative.       Objective:  Physical Exam Vitals signs and nursing note reviewed.  Constitutional:      Appearance: He is well-developed.  Pulmonary:     Effort: Pulmonary effort is normal.  Musculoskeletal: Normal range of motion.  Skin:    General: Skin is warm.  Neurological:     Mental Status: He is alert.     Neurovascular status intact muscle strength found to be adequate range of motion was within normal limits with patient found to have mild discomfort plantar heels but overall pretty good with history of orthotics which have been very valuable in reducing plantar fascial symptomatology     Assessment:  Chronic plantar fasciitis kept under control with orthotics     Plan:  Reviewed condition recommended new orthotics and patient is casted for functional orthotic devices.  We will have other ones rework once the new ones are in and we gave all education concerning  X-rays indicates first with no significant change in depression of the arch

## 2019-06-30 ENCOUNTER — Telehealth: Payer: Self-pay | Admitting: Podiatry

## 2019-06-30 NOTE — Telephone Encounter (Signed)
pts sister picked up his refurbished orthotics and when he got them he said they are not his. They are too small.Pt stated there is a name on bottom of gwen ard.  I have asked pt to please drop them back off to our office.

## 2019-07-07 ENCOUNTER — Telehealth: Payer: Self-pay | Admitting: Podiatry

## 2019-07-07 NOTE — Telephone Encounter (Signed)
Pt left message stating he left a message last week and did not hear back about receiving the wrong orthotics.  I returned call and spoke to pt on 11.30.2020 and we discussed that he was going to return them.  He was asking if the other pair are in and I told him yes and when he drops the other pair off he can pick up the other pair.

## 2020-09-01 ENCOUNTER — Telehealth: Payer: Self-pay

## 2020-09-01 NOTE — Telephone Encounter (Signed)
NOTES ON FILE  [facility] [phone], SENT REFERRAL TO SCHEDULING

## 2020-09-07 ENCOUNTER — Encounter: Payer: Self-pay | Admitting: Internal Medicine

## 2020-09-07 ENCOUNTER — Ambulatory Visit (INDEPENDENT_AMBULATORY_CARE_PROVIDER_SITE_OTHER): Payer: 59 | Admitting: Internal Medicine

## 2020-09-07 ENCOUNTER — Other Ambulatory Visit: Payer: Self-pay

## 2020-09-07 VITALS — BP 126/84 | HR 65 | Ht 70.5 in | Wt 174.6 lb

## 2020-09-07 DIAGNOSIS — R079 Chest pain, unspecified: Secondary | ICD-10-CM

## 2020-09-07 NOTE — Progress Notes (Unsigned)
Cardiology Office Note   Date:  09/07/2020   ID:  Nicholas Orozco, DOB [birth year], MRN [id number]  PCP:  Mechele Claude, MD  Cardiologist:   Dietrich Pates, MD   Pt referred for evaluation of CP      History of Present Illness: Nicholas Orozco is a 35 y.o. male with a history of CP He was previously followed by [name] in clinic (pediatric cardiology).  He had a murmur  (no echoes to review or clinic notes)    He said he had CP as a child  Squeezing   Lasted seconds    Intermittent   IN his 53s he says he did not have problems with CP   He was active      NOw the episodes are differeent   Two weeks ago he was at work   Standing   Hit suddenly   Hard.   Went away   He went home   Felt fatigued after     That same night he had a HA then nose bleed   When he laid down he has pressure on chest that he had not had before   Not pleuritic or positional.  He was seen by PCP the following Monday.    Then Fr last week he had another episode of chest pressure   Dull   Occurred while sitting    Eased off      Can last for days   Pressure like    The pt denies fevers, chills   The pt says he is active   Works out with wife who is a Psychologist, educational          Current Meds  Medication Sig  . Multiple Vitamins-Minerals (MULTIVITAMIN WITH MINERALS) tablet Take 1 tablet by mouth daily.     Allergies:   Patient has no known allergies.   Past Medical History:  Diagnosis Date  . Allergy   . Chest pain   . Eczema   . 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 Seasonal allergies     Past Surgical History:  Procedure Laterality Date  . HAND SURGERY    . HYDROCELE EXCISION / REPAIR    . WISDOM TOOTH EXTRACTION       Social History:  The patient  reports that he has never smoked. He has quit using smokeless tobacco. He reports that he does not drink alcohol and does not use drugs.   Family History:  The patient's family history includes Asthma  in his sister and sister; Autoimmune disease in his sister.    ROS:  Please see the history of present illness. All other systems are reviewed and  Negative to the above problem except as noted.    PHYSICAL EXAM: VS:  BP 126/84   Pulse 65   Ht 5' 10.5" (1.791 m)   Wt 174 lb 9.6 oz (79.2 kg)   BMI 24.70 kg/m   GEN: Well nourished, well developed, in no acute distress  HEENT: normal  Neck: no JVD, carotid bruits, or masses Cardiac: RRR; no murmurs.  No LE  edema  Respiratory:  clear to auscultation bilaterally, n GI: soft, nontender, nondistended, + BS  No hepatomegaly  MS: no deformity Moving all extremities   Skin: warm and dry, no rash Neuro:  Strength and sensation are intact Psych: euthymic mood, full affect   EKG:  EKG is ordered today.  SR   65 bpm     Lipid Panel No results found for: CHOL, TRIG, HDL, CHOLHDL, VLDL, LDLCALC, LDLDIRECT    Wt Readings from Last 3 Encounters:  09/07/20 174 lb 9.6 oz (79.2 kg)  10/30/17 181 lb 12.8 oz (82.5 kg)  09/28/17 177 lb (80.3 kg)      ASSESSMENT AND PLAN:  1  CHest pain   Pt with hx of CP in past    Now with new types of pain   Atypical    Does not sound like CAD   ? Spasm  ? Bridge    Does not sound GI in origin   Nor pulmonic in origin.     Concerning because of its intensity   It has taken him out on one occasion Given Hx will set up for an echocardiogram to eval LV / RV function; valvular function   FUrhter testing based on results  Consider CT angiogram.     2  HCM   Need to get lipids      Current medicines are reviewed at length with the patient today.  The patient does not have concerns regarding medicines.  Signed, Dietrich Pates, MD  09/07/2020 3:30 PM    [facility] [address] [place], [place], [place]  [address] Phone: [phone]; Fax: [phone]

## 2020-09-07 NOTE — Patient Instructions (Signed)
Medication Instructions:  No changes *If you need a refill on your cardiac medications before your next appointment, please call your pharmacy*   Lab Work: Today: lipids/bmet If you have labs (blood work) drawn today and your tests are completely normal, you will receive your results only by: Marland Kitchen MyChart Message (if you have MyChart) OR . A paper copy in the mail If you have any lab test that is abnormal or we need to change your treatment, we will call you to review the results.   Testing/Procedures: Your physician has requested that you have an echocardiogram. Echocardiography is a painless test that uses sound waves to create images of your heart. It provides your doctor with information about the size and shape of your heart and how well your heart's chambers and valves are working. This procedure takes approximately one hour. There are no restrictions for this procedure.   Follow-Up: Follow up with your physician will depend on test results.   Other Instructions

## 2020-09-08 LAB — BASIC METABOLIC PANEL
BUN/Creatinine Ratio: 13 (ref 9–20)
BUN: 13 mg/dL (ref 6–20)
CO2: 23 mmol/L (ref 20–29)
Calcium: 9.6 mg/dL (ref 8.7–10.2)
Chloride: 101 mmol/L (ref 96–106)
Creatinine, Ser: 1 mg/dL (ref 0.76–1.27)
GFR calc Af Amer: 114 mL/min/{1.73_m2} (ref >59)
GFR calc non Af Amer: 98 mL/min/{1.73_m2} (ref >59)
Glucose: 94 mg/dL (ref 65–99)
Potassium: 4 mmol/L (ref 3.5–5.2)
Sodium: 141 mmol/L (ref 134–144)

## 2020-09-08 LAB — LIPID PANEL
Chol/HDL Ratio: 3.7 ratio (ref 0.0–5.0)
Cholesterol, Total: 251 mg/dL — ABNORMAL HIGH (ref 100–199)
HDL: 67 mg/dL (ref >39)
LDL Chol Calc (NIH): 142 mg/dL — ABNORMAL HIGH (ref 0–99)
Triglycerides: 233 mg/dL — ABNORMAL HIGH (ref 0–149)
VLDL Cholesterol Cal: 42 mg/dL — ABNORMAL HIGH (ref 5–40)

## 2020-09-28 ENCOUNTER — Other Ambulatory Visit: Payer: Self-pay

## 2020-09-28 ENCOUNTER — Ambulatory Visit (HOSPITAL_COMMUNITY): Payer: 59 | Attending: Cardiovascular Disease

## 2020-09-28 DIAGNOSIS — R079 Chest pain, unspecified: Secondary | ICD-10-CM | POA: Insufficient documentation

## 2020-09-28 LAB — ECHOCARDIOGRAM COMPLETE
Area-P 1/2: 1.49 cm2
S' Lateral: 2.6 cm

## 2021-11-10 ENCOUNTER — Telehealth: Payer: 59 | Admitting: Family Medicine

## 2021-11-10 DIAGNOSIS — J019 Acute sinusitis, unspecified: Secondary | ICD-10-CM | POA: Diagnosis not present

## 2021-11-10 DIAGNOSIS — B9689 Other specified bacterial agents as the cause of diseases classified elsewhere: Secondary | ICD-10-CM | POA: Diagnosis not present

## 2021-11-10 MED ORDER — AMOXICILLIN-POT CLAVULANATE 875-125 MG PO TABS: 1.0000 | ORAL_TABLET | Freq: Two times a day (BID) | ORAL | 0 refills | Status: AC

## 2021-11-10 NOTE — Progress Notes (Signed)

## 2022-10-26 ENCOUNTER — Telehealth: Payer: 59 | Admitting: Nurse Practitioner

## 2022-10-26 DIAGNOSIS — J40 Bronchitis, not specified as acute or chronic: Secondary | ICD-10-CM | POA: Diagnosis not present

## 2022-10-26 MED ORDER — AZITHROMYCIN 250 MG PO TABS: ORAL_TABLET | ORAL | 0 refills | Status: AC

## 2022-10-26 MED ORDER — PROMETHAZINE-DM 6.25-15 MG/5ML PO SYRP: 5.0000 mL | ORAL_SOLUTION | Freq: Four times a day (QID) | ORAL | 0 refills | Status: DC | PRN

## 2022-10-26 NOTE — Progress Notes (Signed)
E-Visit for Cough  We are sorry that you are not feeling well.  Here is how we plan to help!  Based on your presentation I believe you most likely have A cough due to bacteria.  When patients have a fever and a productive cough with a change in color or increased sputum production, we are concerned about bacterial bronchitis.  If left untreated it can progress to pneumonia.  If your symptoms do not improve with your treatment plan it is important that you contact your provider.   I have prescribed Azithromyin 250 mg: two tablets now and then one tablet daily for 4 additonal days    In addition I have prescribed a prescription cough syrup  From your responses in the eVisit questionnaire you describe inflammation in the upper respiratory tract which is causing a significant cough.  This is commonly called Bronchitis and has four common causes:   Allergies Viral Infections Acid Reflux Bacterial Infection Allergies, viruses and acid reflux are treated by controlling symptoms or eliminating the cause. An example might be a cough caused by taking certain blood pressure medications. You stop the cough by changing the medication. Another example might be a cough caused by acid reflux. Controlling the reflux helps control the cough.  USE OF BRONCHODILATOR ("RESCUE") INHALERS: There is a risk from using your bronchodilator too frequently.  The risk is that over-reliance on a medication which only relaxes the muscles surrounding the breathing tubes can reduce the effectiveness of medications prescribed to reduce swelling and congestion of the tubes themselves.  Although you feel brief relief from the bronchodilator inhaler, your asthma may actually be worsening with the tubes becoming more swollen and filled with mucus.  This can delay other crucial treatments, such as oral steroid medications. If you need to use a bronchodilator inhaler daily, several times per day, you should discuss this with your provider.   There are probably better treatments that could be used to keep your asthma under control.     HOME CARE Only take medications as instructed by your medical team. Complete the entire course of an antibiotic. Drink plenty of fluids and get plenty of rest. Avoid close contacts especially the very young and the elderly Cover your mouth if you cough or cough into your sleeve. Always remember to wash your hands A steam or ultrasonic humidifier can help congestion.   GET HELP RIGHT AWAY IF: You develop worsening fever. You become short of breath You cough up blood. Your symptoms persist after you have completed your treatment plan MAKE SURE YOU  Understand these instructions. Will watch your condition. Will get help right away if you are not doing well or get worse.    Thank you for choosing an e-visit.  Your e-visit answers were reviewed by a board certified advanced clinical practitioner to complete your personal care plan. Depending upon the condition, your plan could have included both over the counter or prescription medications.  Please review your pharmacy choice. Make sure the pharmacy is open so you can pick up prescription now. If there is a problem, you may contact your provider through [employer] and have the prescription routed to another pharmacy.  Your safety is important to [place]. If you have drug allergies check your prescription carefully.   For the next 24 hours you can use MyChart to ask questions about today's visit, request a non-urgent call back, or ask for a work or school excuse. You will get an email in the next  two days asking about your experience. I hope that your e-visit has been valuable and will speed your recovery.

## 2022-10-26 NOTE — Progress Notes (Signed)
I have spent 5 minutes in review of e-visit questionnaire, review and updating patient chart, medical decision making and response to patient.  ° °Nicholas Orozco W Kathleene Bergemann, NP ° °  °

## 2023-10-10 ENCOUNTER — Telehealth: Admitting: Physician Assistant

## 2023-10-10 DIAGNOSIS — J019 Acute sinusitis, unspecified: Secondary | ICD-10-CM | POA: Diagnosis not present

## 2023-10-10 DIAGNOSIS — B9689 Other specified bacterial agents as the cause of diseases classified elsewhere: Secondary | ICD-10-CM

## 2023-10-10 MED ORDER — AMOXICILLIN-POT CLAVULANATE 875-125 MG PO TABS: 1.0000 | ORAL_TABLET | Freq: Two times a day (BID) | ORAL | 0 refills | Status: DC

## 2023-10-10 NOTE — Progress Notes (Signed)

## 2023-10-10 NOTE — Progress Notes (Signed)
 I have spent 5 minutes in review of e-visit questionnaire, review and updating patient chart, medical decision making and response to patient.   Piedad Climes, PA-C

## 2024-01-11 ENCOUNTER — Ambulatory Visit (INDEPENDENT_AMBULATORY_CARE_PROVIDER_SITE_OTHER): Admitting: Family Medicine

## 2024-01-11 ENCOUNTER — Encounter: Payer: Self-pay | Admitting: Family Medicine

## 2024-01-11 VITALS — BP 112/63 | HR 68 | Temp 97.6°F | Ht 70.5 in | Wt 167.4 lb

## 2024-01-11 DIAGNOSIS — Z130 Encounter for screening for diseases of the blood and blood-forming organs and certain disorders involving the immune mechanism: Secondary | ICD-10-CM

## 2024-01-11 DIAGNOSIS — F320 Major depressive disorder, single episode, mild: Secondary | ICD-10-CM

## 2024-01-11 DIAGNOSIS — Z0001 Encounter for general adult medical examination with abnormal findings: Secondary | ICD-10-CM | POA: Diagnosis not present

## 2024-01-11 DIAGNOSIS — G43909 Migraine, unspecified, not intractable, without status migrainosus: Secondary | ICD-10-CM

## 2024-01-11 DIAGNOSIS — Z0000 Encounter for general adult medical examination without abnormal findings: Secondary | ICD-10-CM

## 2024-01-11 DIAGNOSIS — R202 Paresthesia of skin: Secondary | ICD-10-CM | POA: Diagnosis not present

## 2024-01-11 DIAGNOSIS — Z13220 Encounter for screening for lipoid disorders: Secondary | ICD-10-CM

## 2024-01-11 DIAGNOSIS — Z1329 Encounter for screening for other suspected endocrine disorder: Secondary | ICD-10-CM

## 2024-01-11 DIAGNOSIS — G479 Sleep disorder, unspecified: Secondary | ICD-10-CM

## 2024-01-11 DIAGNOSIS — F411 Generalized anxiety disorder: Secondary | ICD-10-CM

## 2024-01-11 DIAGNOSIS — Z13228 Encounter for screening for other metabolic disorders: Secondary | ICD-10-CM

## 2024-01-11 MED ORDER — RIZATRIPTAN BENZOATE 10 MG PO TABS: 10.0000 mg | ORAL_TABLET | ORAL | 11 refills | Status: DC | PRN

## 2024-01-11 NOTE — Patient Instructions (Signed)
 Health Maintenance, Male  Adopting a healthy lifestyle and getting preventive care are important in promoting health and wellness. Ask your health care provider about:  The right schedule for you to have regular tests and exams.  Things you can do on your own to prevent diseases and keep yourself healthy.  What should I know about diet, weight, and exercise?  Eat a healthy diet    Eat a diet that includes plenty of vegetables, fruits, low-fat dairy products, and lean protein.  Do not eat a lot of foods that are high in solid fats, added sugars, or sodium.  Maintain a healthy weight  Body mass index (BMI) is a measurement that can be used to identify possible weight problems. It estimates body fat based on height and weight. Your health care provider can help determine your BMI and help you achieve or maintain a healthy weight.  Get regular exercise  Get regular exercise. This is one of the most important things you can do for your health. Most adults should:  Exercise for at least 150 minutes each week. The exercise should increase your heart rate and make you sweat (moderate-intensity exercise).  Do strengthening exercises at least twice a week. This is in addition to the moderate-intensity exercise.  Spend less time sitting. Even light physical activity can be beneficial.  Watch cholesterol and blood lipids  Have your blood tested for lipids and cholesterol at 37 years of age, then have this test every 5 years.  You may need to have your cholesterol levels checked more often if:  Your lipid or cholesterol levels are high.  You are older than 37 years of age.  You are at high risk for heart disease.  What should I know about cancer screening?  Many types of cancers can be detected early and may often be prevented. Depending on your health history and family history, you may need to have cancer screening at various ages. This may include screening for:  Colorectal cancer.  Prostate cancer.  Skin cancer.  Lung  cancer.  What should I know about heart disease, diabetes, and high blood pressure?  Blood pressure and heart disease  High blood pressure causes heart disease and increases the risk of stroke. This is more likely to develop in people who have high blood pressure readings or are overweight.  Talk with your health care provider about your target blood pressure readings.  Have your blood pressure checked:  Every 3-5 years if you are 37-95 years of age.  Every year if you are 37 years old or older.  If you are between the ages of 29 and 29 and are a current or former smoker, ask your health care provider if you should have a one-time screening for abdominal aortic aneurysm (AAA).  Diabetes  Have regular diabetes screenings. This checks your fasting blood sugar level. Have the screening done:  Once every three years after age 37 if you are at a normal weight and have a low risk for diabetes.  More often and at a younger age if you are overweight or have a high risk for diabetes.  What should I know about preventing infection?  Hepatitis B  If you have a higher risk for hepatitis B, you should be screened for this virus. Talk with your health care provider to find out if you are at risk for hepatitis B infection.  Hepatitis C  Blood testing is recommended for:  Everyone born from [birth year] through 1964.  Anyone  with known risk factors for hepatitis C.  Sexually transmitted infections (STIs)  You should be screened each year for STIs, including gonorrhea and chlamydia, if:  You are sexually active and are younger than 37 years of age.  You are older than 37 years of age and your health care provider tells you that you are at risk for this type of infection.  Your sexual activity has changed since you were last screened, and you are at increased risk for chlamydia or gonorrhea. Ask your health care provider if you are at risk.  Ask your health care provider about whether you are at high risk for HIV. Your health care provider  may recommend a prescription medicine to help prevent HIV infection. If you choose to take medicine to prevent HIV, you should first get tested for HIV. You should then be tested every 3 months for as long as you are taking the medicine.  Follow these instructions at home:  Alcohol use  Do not drink alcohol if your health care provider tells you not to drink.  If you drink alcohol:  Limit how much you have to 0-2 drinks a day.  Know how much alcohol is in your drink. In the [place] one drink equals one 12 oz bottle of beer (355 mL), one 5 oz glass of wine (148 mL), or one 1 oz glass of hard liquor (44 mL).  Lifestyle  Do not use any products that contain nicotine or tobacco. These products include cigarettes, chewing tobacco, and vaping devices, such as e-cigarettes. If you need help quitting, ask your health care provider.  Do not use street drugs.  Do not share needles.  Ask your health care provider for help if you need support or information about quitting drugs.  General instructions  Schedule regular health, dental, and eye exams.  Stay current with your vaccines.  Tell your health care provider if:  You often feel depressed.  You have ever been abused or do not feel safe at home.  Summary  Adopting a healthy lifestyle and getting preventive care are important in promoting health and wellness.  Follow your health care provider's instructions about healthy diet, exercising, and getting tested or screened for diseases.  Follow your health care provider's instructions on monitoring your cholesterol and blood pressure.  This information is not intended to replace advice given to you by your health care provider. Make sure you discuss any questions you have with your health care provider.  Document Revised: 12/06/2020 Document Reviewed: 12/06/2020  Elsevier Patient Education  2024 [employer].
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
## 2024-01-12 LAB — CBC WITH DIFFERENTIAL/PLATELET
Basophils Absolute: 0.1 10*3/uL (ref 0.0–0.2)
Basos: 2 %
EOS (ABSOLUTE): 0.1 10*3/uL (ref 0.0–0.4)
Eos: 2 %
Hematocrit: 40.1 % (ref 37.5–51.0)
Hemoglobin: 13.4 g/dL (ref 13.0–17.7)
Immature Grans (Abs): 0 10*3/uL (ref 0.0–0.1)
Immature Granulocytes: 0 %
Lymphocytes Absolute: 1.9 10*3/uL (ref 0.7–3.1)
Lymphs: 35 %
MCH: 31.6 pg (ref 26.6–33.0)
MCHC: 33.4 g/dL (ref 31.5–35.7)
MCV: 95 fL (ref 79–97)
Monocytes Absolute: 0.6 10*3/uL (ref 0.1–0.9)
Monocytes: 11 %
Neutrophils Absolute: 2.7 10*3/uL (ref 1.4–7.0)
Neutrophils: 49 %
Platelets: 210 10*3/uL (ref 150–450)
RBC: 4.24 x10E6/uL (ref 4.14–5.80)
RDW: 12.3 % (ref 11.6–15.4)
WBC: 5.5 10*3/uL (ref 3.4–10.8)

## 2024-01-12 LAB — LIPID PANEL
Chol/HDL Ratio: 3.3 ratio (ref 0.0–5.0)
Cholesterol, Total: 247 mg/dL — ABNORMAL HIGH (ref 100–199)
HDL: 76 mg/dL (ref >39)
LDL Chol Calc (NIH): 152 mg/dL — ABNORMAL HIGH (ref 0–99)
Triglycerides: 112 mg/dL (ref 0–149)
VLDL Cholesterol Cal: 19 mg/dL (ref 5–40)

## 2024-01-12 LAB — CMP14+EGFR
ALT: 16 IU/L (ref 0–44)
AST: 17 IU/L (ref 0–40)
Albumin: 4.8 g/dL (ref 4.1–5.1)
Alkaline Phosphatase: 57 IU/L (ref 44–121)
BUN/Creatinine Ratio: 17 (ref 9–20)
BUN: 15 mg/dL (ref 6–20)
Bilirubin Total: 0.5 mg/dL (ref 0.0–1.2)
CO2: 25 mmol/L (ref 20–29)
Calcium: 9.6 mg/dL (ref 8.7–10.2)
Chloride: 102 mmol/L (ref 96–106)
Creatinine, Ser: 0.9 mg/dL (ref 0.76–1.27)
Globulin, Total: 2.2 g/dL (ref 1.5–4.5)
Glucose: 99 mg/dL (ref 70–99)
Potassium: 3.7 mmol/L (ref 3.5–5.2)
Sodium: 140 mmol/L (ref 134–144)
Total Protein: 7 g/dL (ref 6.0–8.5)
eGFR: 113 mL/min/{1.73_m2} (ref >59)

## 2024-01-12 LAB — VITAMIN B12: Vitamin B-12: 696 pg/mL (ref 232–1245)

## 2024-01-12 LAB — TSH: TSH: 1.2 u[IU]/mL (ref 0.450–4.500)

## 2024-01-14 ENCOUNTER — Ambulatory Visit: Payer: Self-pay | Admitting: Family Medicine

## 2024-01-14 DIAGNOSIS — F411 Generalized anxiety disorder: Secondary | ICD-10-CM | POA: Insufficient documentation

## 2024-01-14 DIAGNOSIS — G479 Sleep disorder, unspecified: Secondary | ICD-10-CM | POA: Insufficient documentation

## 2024-01-14 DIAGNOSIS — F320 Major depressive disorder, single episode, mild: Secondary | ICD-10-CM | POA: Insufficient documentation

## 2024-01-16 ENCOUNTER — Other Ambulatory Visit: Payer: Self-pay | Admitting: Family Medicine

## 2024-01-16 DIAGNOSIS — E291 Testicular hypofunction: Secondary | ICD-10-CM

## 2024-01-17 ENCOUNTER — Other Ambulatory Visit

## 2024-01-17 DIAGNOSIS — E291 Testicular hypofunction: Secondary | ICD-10-CM

## 2024-01-20 LAB — TESTOSTERONE,FREE AND TOTAL
Testosterone, Free: 9 pg/mL (ref 8.7–25.1)
Testosterone: 454 ng/dL (ref 264–916)

## 2024-01-21 ENCOUNTER — Ambulatory Visit: Payer: Self-pay | Admitting: Family Medicine

## 2024-03-13 ENCOUNTER — Telehealth: Payer: Self-pay

## 2024-03-13 NOTE — Telephone Encounter (Signed)
 Pt called in complaining of dull, left sided pain that began 3 days ago. Pt denies urinary concerns, no nausea or vomiting. Appt scheduled for tomorrow with DOD. Advised pt to go to ER if pain becomes severe or any other symptoms develop. Pt verbalized understanding

## 2024-03-14 ENCOUNTER — Encounter: Payer: Self-pay | Admitting: Family Medicine

## 2024-03-14 ENCOUNTER — Other Ambulatory Visit

## 2024-03-14 ENCOUNTER — Ambulatory Visit (INDEPENDENT_AMBULATORY_CARE_PROVIDER_SITE_OTHER)

## 2024-03-14 ENCOUNTER — Telehealth (INDEPENDENT_AMBULATORY_CARE_PROVIDER_SITE_OTHER): Admitting: Family Medicine

## 2024-03-14 DIAGNOSIS — R1012 Left upper quadrant pain: Secondary | ICD-10-CM

## 2024-03-14 LAB — URINALYSIS, COMPLETE
Bilirubin, UA: NEGATIVE
Glucose, UA: NEGATIVE
Ketones, UA: NEGATIVE
Leukocytes,UA: NEGATIVE
Nitrite, UA: NEGATIVE
Protein,UA: NEGATIVE
RBC, UA: NEGATIVE
Specific Gravity, UA: 1.015 (ref 1.005–1.030)
Urobilinogen, Ur: 0.2 mg/dL (ref 0.2–1.0)
pH, UA: 7 (ref 5.0–7.5)

## 2024-03-14 LAB — MICROSCOPIC EXAMINATION
Epithelial Cells (non renal): NONE SEEN /HPF (ref 0–10)
RBC, Urine: NONE SEEN /HPF (ref 0–2)
Renal Epithel, UA: NONE SEEN /HPF
Yeast, UA: NONE SEEN

## 2024-03-14 NOTE — Progress Notes (Signed)
 Virtual Visit via MyChart video note  I connected with Nicholas Orozco on 03/14/24 at 1335 by video and verified that I am speaking with the correct person using two identifiers. Nicholas Orozco is currently located at home and patient are currently with her during visit. The provider, Fonda LABOR Shaquna Geigle, MD is located in their office at time of visit.  Call ended at 1347  I discussed the limitations, risks, security and privacy concerns of performing an evaluation and management service by video and the availability of in person appointments. I also discussed with the patient that there may be a patient responsible charge related to this service. The patient expressed understanding and agreed to proceed.   History and Present Illness: Discussed the use of AI scribe software for clinical note transcription with the patient, who gave verbal consent to proceed.  History of Present Illness   Nicholas Orozco is a 38 year old male who presents with a dull ache in the left abdomen.  He has experienced a dull ache in the left abdomen, just below the bottom rib, for the past three to four days. The pain is intermittent, described as a 'dull, kind of a squeeze feeling' that occurs every ten seconds to five minutes. It is not exacerbated by movement or position changes and does not worsen with pressure. The pain is not severe and does not resemble the intense pain he associates with kidney stones, which he has experienced in the past.  The pain occasionally radiates to the left side of the back, specifically in the middle to lower back area, corresponding to the same rib level. He denies any recent trauma or falls and has not engaged in any unusual physical activity that might have caused a muscle strain, although he did return to his regular gym routine after a vacation.  He maintains a healthy lifestyle, with regular gym activity and a diet consisting mostly of whole foods. He mentions an  increase in snacking on sugary foods at work recently, which is atypical for him. No nausea, vomiting, changes in bowel habits, urinary symptoms, or increased gas. He has not experienced any recent back pain outside of his usual lower back discomfort, which he manages with monthly chiropractic visits.  No symptoms suggestive of gastrointestinal reflux, such as increased belching or burping. No symptoms of constipation or diarrhea, maintaining a regular bowel schedule. He has not noticed any changes in his urinary habits, such as burning, pain, or blood in the urine.       1. Left upper quadrant pain     Outpatient Encounter Medications as of 03/14/2024  Medication Sig   Multiple Vitamins-Minerals (MULTIVITAMIN WITH MINERALS) tablet Take 1 tablet by mouth daily.   rizatriptan  (MAXALT ) 10 MG tablet Take 1 tablet (10 mg total) by mouth as needed for migraine. May repeat in 2 hours if needed   No facility-administered encounter medications on file as of 03/14/2024.    Review of Systems  Constitutional:  Negative for chills and fever.  Respiratory:  Negative for shortness of breath and wheezing.   Cardiovascular:  Negative for chest pain and leg swelling.  Gastrointestinal:  Positive for abdominal pain. Negative for abdominal distention, constipation, diarrhea, nausea and vomiting.  Musculoskeletal:  Negative for back pain and gait problem.  Skin:  Negative for rash.  All other systems reviewed and are negative.   Observations/Objective: Patient sounds comfortable and in no acute distress  Assessment and Plan: Problem List Items Addressed  This Visit   None Visit Diagnoses       Left upper quadrant pain    -  Primary   Relevant Orders   DG Abd 1 View   Urinalysis, Complete   Urine Culture   CBC with Differential/Platelet   [id number]+EGFR         Left upper abdominal pain Intermittent dull ache likely due to muscular strain from recent gym activities. Less likely gastrointestinal,  splenic, or pancreatic issues. - Order abdominal x-ray and urinalysis. - Order blood work. - Schedule lab and x-ray appointments. - Advised to go to urgent care or ER if symptoms worsen over the weekend.        Follow up plan: Return if symptoms worsen or fail to improve.     I discussed the assessment and treatment plan with the patient. The patient was provided an opportunity to ask questions and all were answered. The patient agreed with the plan and demonstrated an understanding of the instructions.   The patient was advised to call back or seek an in-person evaluation if the symptoms worsen or if the condition fails to improve as anticipated.  The above assessment and management plan was discussed with the patient. The patient verbalized understanding of and has agreed to the management plan. Patient is aware to call the clinic if symptoms persist or worsen. Patient is aware when to return to the clinic for a follow-up visit. Patient educated on when it is appropriate to go to the emergency department.    I provided 12 minutes of non-face-to-face time during this encounter.    Fonda DELENA Levins, MD

## 2024-03-15 LAB — CBC WITH DIFFERENTIAL/PLATELET
Basophils Absolute: 0.1 x10E3/uL (ref 0.0–0.2)
Basos: 2 %
EOS (ABSOLUTE): 0.2 x10E3/uL (ref 0.0–0.4)
Eos: 3 %
Hematocrit: 43.4 % (ref 37.5–51.0)
Hemoglobin: 14.2 g/dL (ref 13.0–17.7)
Immature Grans (Abs): 0 x10E3/uL (ref 0.0–0.1)
Immature Granulocytes: 0 %
Lymphocytes Absolute: 2.2 x10E3/uL (ref 0.7–3.1)
Lymphs: 37 %
MCH: 31 pg (ref 26.6–33.0)
MCHC: 32.7 g/dL (ref 31.5–35.7)
MCV: 95 fL (ref 79–97)
Monocytes Absolute: 0.6 x10E3/uL (ref 0.1–0.9)
Monocytes: 10 %
Neutrophils Absolute: 2.8 x10E3/uL (ref 1.4–7.0)
Neutrophils: 48 %
Platelets: 222 x10E3/uL (ref 150–450)
RBC: 4.58 x10E6/uL (ref 4.14–5.80)
RDW: 11.9 % (ref 11.6–15.4)
WBC: 5.8 x10E3/uL (ref 3.4–10.8)

## 2024-03-15 LAB — CMP14+EGFR
ALT: 13 IU/L (ref 0–44)
AST: 17 IU/L (ref 0–40)
Albumin: 4.9 g/dL (ref 4.1–5.1)
Alkaline Phosphatase: 55 IU/L (ref 44–121)
BUN/Creatinine Ratio: 13 (ref 9–20)
BUN: 15 mg/dL (ref 6–20)
Bilirubin Total: 0.6 mg/dL (ref 0.0–1.2)
CO2: 26 mmol/L (ref 20–29)
Calcium: 9.7 mg/dL (ref 8.7–10.2)
Chloride: 103 mmol/L (ref 96–106)
Creatinine, Ser: 1.13 mg/dL (ref 0.76–1.27)
Globulin, Total: 2 g/dL (ref 1.5–4.5)
Glucose: 94 mg/dL (ref 70–99)
Potassium: 3.8 mmol/L (ref 3.5–5.2)
Sodium: 144 mmol/L (ref 134–144)
Total Protein: 6.9 g/dL (ref 6.0–8.5)
eGFR: 86 mL/min/1.73 (ref >59)

## 2024-03-16 LAB — URINE CULTURE: Organism ID, Bacteria: NO GROWTH

## 2024-03-17 ENCOUNTER — Ambulatory Visit: Payer: Self-pay

## 2024-03-17 ENCOUNTER — Ambulatory Visit: Payer: Self-pay | Admitting: Family Medicine

## 2024-03-17 ENCOUNTER — Other Ambulatory Visit: Payer: Self-pay

## 2024-03-17 DIAGNOSIS — R1012 Left upper quadrant pain: Secondary | ICD-10-CM

## 2024-03-17 DIAGNOSIS — R599 Enlarged lymph nodes, unspecified: Secondary | ICD-10-CM

## 2024-03-17 NOTE — Telephone Encounter (Signed)
 FYI Only or Action Required?: FYI only for provider.  Patient was last seen in primary care on 03/14/2024 by Dettinger, Fonda LABOR, MD.  Called Nurse Triage reporting Edema.  Symptoms began a week ago.  Interventions attempted: OTC medications: Ibuprofen.  Symptoms are: gradually worsening.  Triage Disposition: See PCP When Office is Open (Within 3 Days)  Patient/caregiver understands and will follow disposition?: Yes     Copied from CRM #[id number]. Topic: Clinical - Red Word Triage >> Mar 17, 2024  2:32 PM Larissa RAMAN wrote: [facility] that prompted transfer to Nurse Triage: swelling in neck, groin, and knees    ----------------------------------------------------------------------- From previous Reason for Contact - Scheduling: Patient/patient representative is calling to schedule an appointment. Refer to attachments for appointment information. Reason for Disposition  [1] Small swelling or lump AND [2] unexplained AND [3] present > 1 week  Answer Assessment - Initial Assessment Questions Pt states he has a possible knot in area L side of neck. Joint pain in groin/ knee area as well with symptoms of fatigue. Pt states he was recently in [place]  and had some mosquito bites but unsure if he has a tick bite. States sister has similar symptoms and she was bit by a tick. Ibuprofen taken for symptoms. Patient also states he has some pain when turning neck side to side. Patient states he also feels some abdominal pain that comes and goes as well.     1. APPEARANCE of SWELLING: What does it look like?     States his wife could notice swelling but he is unable to see swelling.  2. SIZE: How large is the swelling? (e.g., inches, cm; or compare to size of pinhead, tip of pen, eraser, coin, pea, grape, ping pong ball)      Quarter sized in lymph node area 3. LOCATION: Where is the swelling located?     L side worse than R  4. ONSET: When did the swelling start?     Last week.  5.  COLOR: What color is it? Is there more than one color?     No change in color  6. PAIN: Is there any pain? If Yes, ask: How bad is the pain? (Scale 1-10; or mild, moderate, severe)       3-4/10 7. ITCH: Does it itch? If Yes, ask: How bad is the itch?      Denies itching 8. CAUSE: What do you think caused the swelling?     Pt states he has had swelling in his lymph node area that was caused by severe sinus infections or colds.  9 OTHER SYMPTOMS: Do you have any other symptoms? (e.g., fever)     Fatigue feeling also pain in abd comes and goes.  Protocols used: Skin Lump or Localized Swelling-A-AH

## 2024-03-17 NOTE — Telephone Encounter (Signed)
 noted

## 2024-03-17 NOTE — Telephone Encounter (Signed)
 Appt made.

## 2024-03-18 ENCOUNTER — Ambulatory Visit (INDEPENDENT_AMBULATORY_CARE_PROVIDER_SITE_OTHER): Admitting: Nurse Practitioner

## 2024-03-18 ENCOUNTER — Encounter: Payer: Self-pay | Admitting: Nurse Practitioner

## 2024-03-18 ENCOUNTER — Other Ambulatory Visit: Payer: Self-pay

## 2024-03-18 VITALS — BP 121/70 | HR 70 | Temp 98.1°F | Ht 70.5 in | Wt 164.6 lb

## 2024-03-18 DIAGNOSIS — R599 Enlarged lymph nodes, unspecified: Secondary | ICD-10-CM

## 2024-03-18 DIAGNOSIS — R5383 Other fatigue: Secondary | ICD-10-CM | POA: Insufficient documentation

## 2024-03-18 DIAGNOSIS — R1012 Left upper quadrant pain: Secondary | ICD-10-CM

## 2024-03-18 NOTE — Progress Notes (Signed)
 Acute Office Visit  Subjective:     Patient ID: Nicholas Orozco, male    DOB: 07/07/1986, 38 y.o.   MRN: [id number]  Chief Complaint  Patient presents with   lymph node swelling    Lymph node swelling since last Wednesday and fatigued     HPI Nicholas Orozco is a 38 year old male who presents on March 18, 2024, for an acute visit concerning swollen lymph node and severe fatigue. He was seen via telehealth by Dr. Maryanne on March 14, 2024, at which time labs were reportedly normal. The patient reports extreme fatigue, stating that his legs "feel like jello" and that he had to leave work at [employer] due to lack of energy. He recently returned from a hiking trip in [place]  and is unsure if he was bitten by a tick during the trip. He also reports fire ant bites on his right ankle last week, noting two pustular lesions that are now improving. The patient already has orders for Lyme disease and Spotted Fever testing and would like to be tested for alpha-gal syndrome.   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 Negative for chills and fever.  Respiratory:  Negative for cough and shortness of breath.   Gastrointestinal:  Negative for nausea and vomiting.  Musculoskeletal:  Positive for myalgias.   Neurological:  Negative for dizziness and headaches.   Negative unless indicated in HPI    Objective:    BP 121/70   Pulse 70   Temp 98.1 F (36.7 C) (Temporal)   Ht 5' 10.5 (1.791 m)   Wt 164 lb 9.6 oz (74.7 kg)   SpO2 98%   BMI 23.28 kg/m  BP Readings from Last 3 Encounters:  03/18/24 121/70  01/11/24 112/63  09/07/20 126/84   Wt Readings from Last 3 Encounters:  03/18/24 164 lb 9.6 oz (74.7 kg)  01/11/24 167 lb 6.4 oz (75.9 kg)  09/07/20 174 lb 9.6 oz (79.2 kg)      Physical Exam Vitals and nursing note reviewed.  Constitutional:      Appearance: Normal appearance. He is not ill-appearing.  HENT:     Head: Normocephalic and atraumatic.     Nose: Nose normal.  Eyes:     General: No scleral icterus.    Extraocular Movements: Extraocular movements intact.     Conjunctiva/sclera: Conjunctivae normal.     Pupils: Pupils are equal, round, and reactive to light.  Neck:     Thyroid: No thyroid mass.  Cardiovascular:     Heart sounds: Normal heart sounds.  Pulmonary:     Effort: Pulmonary effort is normal.     Breath sounds: Normal breath sounds.  Musculoskeletal:        General: Normal range of motion.  Cervical back: Full passive range of motion without pain.     Right lower leg: No edema.     Left lower leg: No edema.  Lymphadenopathy:     Head:     Right side of head: Tonsillar adenopathy present. No submandibular adenopathy.     Left side of head: Submandibular adenopathy present. No tonsillar adenopathy.     Cervical: Cervical adenopathy present.     Right cervical: Superficial cervical adenopathy present.     Left cervical: Superficial cervical adenopathy present.  Skin:    General: Skin is warm and dry.     Findings: Rash present. No burn.  Neurological:     Mental Status: He is alert and oriented to person, place, and time.  Psychiatric:        Mood and Affect: Mood normal.        Behavior: Behavior normal.        Thought Content: Thought  content normal.    Pertinent labs & imaging results that were available during my care of the patient were reviewed by me and considered in my medical decision making.  No results found for any visits on 03/18/24.      Assessment & Plan:  Swollen lymph nodes -     Mononucleosis screen -     Alpha-Gal Panel  Other fatigue -     Mononucleosis screen   Nicholas Orozco is a 38 year old Caucasian male seen today for swollen lymph node, no acute distress C/o fatigue/swollen lymph node: Mono spot and alpha gal ordered Nicholas Orozco had ordered for spotted fever and Lyme serology Advised him to get over-the-counter B12 supplement Rest, increase hydration    The above assessment and management plan was discussed with the patient. The patient verbalized understanding of and has agreed to the management plan. Patient is aware to call the clinic if they develop any new symptoms or if symptoms persist or worsen. Patient is aware when to return to the clinic for a follow-up visit. Patient educated on when it is appropriate to go to the emergency department.  Return if symptoms worsen or fail to improve.  Nicholas Adamczak [place] Thompson, DNP [facility] [address] [place], [place] [address] [phone]  Note: This document was prepared by [employer] voice dictation technology and any errors that results from this process are unintentional.

## 2024-03-19 ENCOUNTER — Other Ambulatory Visit

## 2024-03-19 LAB — LYME DISEASE SEROLOGY W/REFLEX: Lyme Total Antibody EIA: NEGATIVE

## 2024-03-19 LAB — SPOTTED FEVER GROUP ANTIBODIES
Spotted Fever Group IgG: <1:64 {titer}
Spotted Fever Group IgM: <1:64 {titer}

## 2024-03-20 ENCOUNTER — Ambulatory Visit: Payer: Self-pay | Admitting: Family Medicine

## 2024-03-21 LAB — ALPHA-GAL PANEL
Allergen Lamb IgE: <0.1 kU/L
Beef IgE: <0.1 kU/L
IgE (Immunoglobulin E), Serum: 11 [IU]/mL (ref 6–495)
O215-IgE Alpha-Gal: <0.1 kU/L
Pork IgE: <0.1 kU/L

## 2024-03-21 LAB — MONONUCLEOSIS SCREEN: Mono Screen: NEGATIVE

## 2024-03-24 ENCOUNTER — Encounter (HOSPITAL_COMMUNITY): Payer: Self-pay

## 2024-03-24 ENCOUNTER — Emergency Department (HOSPITAL_COMMUNITY)
Admission: EM | Admit: 2024-03-24 | Discharge: 2024-03-25 | Discharge status: Against Medical Advice | Attending: Emergency Medicine | Admitting: Emergency Medicine

## 2024-03-24 ENCOUNTER — Ambulatory Visit: Payer: Self-pay | Admitting: Nurse Practitioner

## 2024-03-24 ENCOUNTER — Other Ambulatory Visit: Payer: Self-pay

## 2024-03-24 DIAGNOSIS — Z5321 Procedure and treatment not carried out due to patient leaving prior to being seen by health care provider: Secondary | ICD-10-CM | POA: Diagnosis not present

## 2024-03-24 DIAGNOSIS — R590 Localized enlarged lymph nodes: Secondary | ICD-10-CM | POA: Insufficient documentation

## 2024-03-24 NOTE — ED Triage Notes (Signed)
 Patient in ED with complaints of swelling in the lymph nodes in neck, groin and knees. Patient states that he has been to the doctor 5 times in the last 2 weeks and it is getting worse. He states that his legs feel like jello.

## 2024-03-25 NOTE — ED Notes (Signed)
 Patient decided to leave. Taking him OTF.

## 2024-03-28 ENCOUNTER — Encounter: Payer: Self-pay | Admitting: Nurse Practitioner

## 2024-03-28 ENCOUNTER — Telehealth: Payer: Self-pay | Admitting: Family Medicine

## 2024-03-28 MED ORDER — DOXYCYCLINE HYCLATE 100 MG PO TABS: 100.0000 mg | ORAL_TABLET | Freq: Two times a day (BID) | ORAL | 0 refills | Status: AC

## 2024-03-28 NOTE — Telephone Encounter (Signed)
 I spoke to pt and advised he ntbs in office for further evaluation to determine appropriate treatment/referral and pt is agreeable so scheduled with DOD 9/2

## 2024-03-28 NOTE — Telephone Encounter (Signed)
 Copied from CRM #[id number]. Topic: Referral - Request for Referral >> Mar 28, 2024  2:43 PM Myrick T wrote: Did the patient discuss referral with their provider in the last year? Yes  Appointment offered? No  Type of order/referral and detailed reason for visit: Infectious Disease Specialist  Preference of office, provider, location: [facility]  If referral order, have you been seen by this specialty before? No (If Yes, this issue or another issue? When? Where?  Can we respond through MyChart? Yes

## 2024-03-28 NOTE — Telephone Encounter (Signed)
 Patient called stating that he has been sick for 3 weeks. Had a trip to [place]  and has been sick every since he came home. He was seen by CANDIE Shelvy Ned FNP and was tested for tick related illness and were all negative. CBC was normal. But since then has has continued to be very fatigued with multiple inflamed lymph nodes in neck, groin and axillia. He went to the ED 3 days ago but left without being seen. He thinks he has had a low grade fever off and on. Wants referral to infectious disease. Thinking tat he may have picked up something in [facility].  - referral to infectious disease made. Will try dose of doxycycline  for lymphadenopathy while we are waiting on referral. Patient has appointment Tuesday morning to be seen in office.  - order doxycycline  100mg  1 po bid for 14 days #24 caretha Marjie Lunger, FNP

## 2024-04-01 ENCOUNTER — Encounter: Payer: Self-pay | Admitting: Nurse Practitioner

## 2024-04-01 ENCOUNTER — Ambulatory Visit (INDEPENDENT_AMBULATORY_CARE_PROVIDER_SITE_OTHER): Admitting: Nurse Practitioner

## 2024-04-01 VITALS — BP 137/83 | HR 59 | Temp 97.4°F | Ht 70.0 in | Wt 168.0 lb

## 2024-04-01 DIAGNOSIS — R590 Localized enlarged lymph nodes: Secondary | ICD-10-CM

## 2024-04-01 NOTE — Patient Instructions (Signed)
 Lymphadenopathy  Lymphadenopathy is when your lymph glands are swollen or larger than normal.  Lymph glands, also called lymph nodes, are clumps of tissue. They filter germs and waste from tissues in your body to your bloodstream. They're part of your body's defense system, or immune system. Lymphadenopathy has different causes, like infection, autoimmune disease, and cancer. Lymphadenopathy can happen wherever you have lymph nodes. The type you have depends on which nodes it's in, such as: Cervical lymphadenopathy. This is in the neck. Mediastinal lymphadenopathy. This is in the chest. Hilar lymphadenopathy. This is in the lungs. Axillary lymphadenopathy. This is in the armpits. Inguinal lymphadenopathy. This is in the groin. Sometimes, fluid and cells that fight infection build up in your lymph nodes. This happens when your immune system reacts to germs or other substances that get into your body. This makes lymph nodes swell and get bigger. Treatment is based on what's thought to be the cause. Sometimes, lymph nodes don't go back to normal size after treatment. If yours don't, your health care provider may order tests to help learn why your glands are still swollen and big. Follow these instructions at home:  Take over-the-counter and prescription medicines only as told by your provider. If you were prescribed antibiotics, do not stop using them, even if you start to feel better. If told, apply heat to swollen lymph nodes as told by your provider. Use the heat source that your provider recommends, such as a moist heat pack or a heating pad. Place a towel between your skin and the heat source. Leave the heat on only for the time told by your provider to avoid injury. If your skin turns bright red, remove the heat right away to prevent burns. The risk of burns is higher if you cannot feel pain, heat, or cold. Check your swollen lymph nodes every day for changes. Check other places where you have  lymph nodes as told. Check for changes such as: More swelling. Sudden growth in size. Redness or pain. Hardness. Contact a health care provider if: You have lymph nodes that: Are still swollen after 2 weeks. Have gotten bigger all of a sudden or the swelling spreads. Are red, painful, or hard. Fluid leaks from the skin near a swollen lymph node. You get a fever, chills, or night sweats. You feel tired. You have a sore throat. Your abdomen hurts. You lose weight without trying. This information is not intended to replace advice given to you by your health care provider. Make sure you discuss any questions you have with your health care provider. Document Revised: 10/11/2022 Document Reviewed: 10/11/2022 Elsevier Patient Education  2024 [employer].

## 2024-04-01 NOTE — Progress Notes (Signed)
 Subjective:    Patient ID: Nicholas Orozco, male    DOB: 27-May-1986, 38 y.o.   MRN: [id number]   Chief Complaint: fatigue  HPI  Patient went to [place]  in July and did some hiking. Every since then he has felt bad. Very fatigued. Swollen lymph nodes. Muscle spasms and joint  pain. Foul smelling urine. Frequent tick bites but lyme titer was negative and RMSF test did not complete. Mono was negative in office but MOno spot at home was positive.SABRA He just feels terrible.  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  Eyes:  Negative for pain.  Respiratory:  Negative for shortness of breath.   Cardiovascular:  Negative for chest pain, palpitations and leg swelling.  Gastrointestinal:  Negative for abdominal pain.  Endocrine: Negative for polydipsia.  Skin:  Negative for rash.  Neurological:  Negative for dizziness, weakness and headaches.  Hematological:  Does not bruise/bleed easily.  All other systems reviewed and are negative.      Objective:   Physical Exam Vitals and nursing note reviewed.  Constitutional:      Appearance: Normal appearance. He is well-developed.  HENT:     Head: Normocephalic.     Nose: Nose normal.     Mouth/Throat:     Mouth: Mucous membranes are moist.     Pharynx: Oropharynx is clear.  Eyes:     Pupils: Pupils are equal, round, and reactive to light.  Neck:     Thyroid : No thyroid  mass or thyromegaly.     Vascular: No carotid bruit or JVD.     Trachea: Phonation normal.   Cardiovascular:     Rate and Rhythm: Normal rate and regular rhythm.  Pulmonary:     Effort: Pulmonary effort is normal. No respiratory distress.     Breath sounds: Normal breath sounds.  Abdominal:     General: Bowel sounds are normal.     Palpations: Abdomen is soft.     Tenderness: There is no abdominal tenderness.  Musculoskeletal:        General: Normal range of motion.     Cervical back: Normal range of motion and neck supple.  Lymphadenopathy:     Cervical: Cervical adenopathy (right) present.  Skin:    General: Skin is warm and dry.  Neurological:     Mental Status: He is alert and oriented to person, place, and time.  Psychiatric:        Behavior: Behavior normal.        Thought Content: Thought content normal.        Judgment: Judgment normal.    BP 137/83   Pulse (!) 59   Temp (!) 97.4 F (36.3 C) (Temporal)   Ht 5' 10 (1.778 m)   Wt 168 lb (76.2 kg)   SpO2 100%   BMI 24.11 kg/m         Assessment & Plan:   ALLISTER LESSLEY in today with chief complaint of lymphadenopathy  1. Lymphadenopathy of right cervical region (Primary) Continue doxycycline  Force fluids  Labs pending - CBC with Differential/Platelet - CMP14+EGFR - Spotted Fever Group Antibodies - EBV VCA/EA Ab, IgG - Thyroid  Panel With TSH - ANA,IFA RA Diag Pnl w/rflx Tit/Patn    The above assessment and management plan was discussed with the patient. The patient verbalized understanding of and has agreed to the management plan. Patient is aware to call the clinic if symptoms persist or worsen. Patient is aware when to return to the clinic for a follow-up visit. Patient educated on when it is appropriate to go to the emergency department.   Mary-Margaret Gladis, FNP

## 2024-04-02 LAB — SPOTTED FEVER GROUP ANTIBODIES
Spotted Fever Group IgG: <1:64 {titer}
Spotted Fever Group IgM: <1:64 {titer}

## 2024-04-03 ENCOUNTER — Ambulatory Visit: Payer: Self-pay | Admitting: Nurse Practitioner

## 2024-04-03 ENCOUNTER — Ambulatory Visit (INDEPENDENT_AMBULATORY_CARE_PROVIDER_SITE_OTHER): Admitting: Internal Medicine

## 2024-04-03 ENCOUNTER — Other Ambulatory Visit: Payer: Self-pay

## 2024-04-03 ENCOUNTER — Encounter: Payer: Self-pay | Admitting: Internal Medicine

## 2024-04-03 VITALS — BP 132/87 | HR 80 | Temp 98.5°F | Resp 16

## 2024-04-03 DIAGNOSIS — R590 Localized enlarged lymph nodes: Secondary | ICD-10-CM

## 2024-04-03 MED ORDER — AZITHROMYCIN 250 MG PO TABS: ORAL_TABLET | ORAL | 0 refills | Status: DC

## 2024-04-03 MED ORDER — ONDANSETRON HCL 4 MG PO TABS: 4.0000 mg | ORAL_TABLET | Freq: Three times a day (TID) | ORAL | 0 refills | Status: DC | PRN

## 2024-04-03 NOTE — Progress Notes (Signed)
 Patient ID: Nicholas Orozco, male   DOB: 1986-06-22, 38 y.o.   MRN: [id number]  HPI  Nicholas Orozco is a 38 year old male who presents with persistent fatigue, joint pain, and lymphadenopathy. He was referred by his primary care doctor for persistent symptoms.  His symptoms began after a trip to [place]  in mid-July, where he experienced mild sinus congestion, possibly due to altitude changes. Upon returning home, significant fatigue started around the beginning of August. He feels unusually exhausted by 5 PM, which is atypical given his active lifestyle and work schedule.  He experiences joint pain, specifically in his knees and elbows, and a sensation of abdominal pressure that comes and goes. Initial evaluations included an x-ray and blood tests, which were unremarkable. However, his lymph nodes began to swell, particularly in the neck, knees, and armpits, although they were not visibly swollen. He describes a tingling sensation in his rib cage and significant leg weakness, to the point where standing at baseball games caused his legs to shake.  He visited the emergency room due to severe leg pain and weakness but left after a long wait without being seen. He has experienced muscle spasms in his arms and legs, with intermittent severe pain in his hamstrings. No fevers, chills, or night sweats.  He was prescribed doxycycline  five days ago. He also notes a red spot on his ankle that appeared after a beach trip following [place] , which has not resolved.  His past medical history includes frequent sinus infections, particularly in the spring, and a recent diagnosis of vitamin D deficiency. He has no known history of mononucleosis, although his wife has had it. [place] encountering ticks, but does not recall any recent tick bites.  Patient has had outside labs which I have reviewed with him -- normal CBC, normal differential, EBV shows past infection. Tickborne  illnesses negative Outpatient Encounter Medications as of 04/03/2024  Medication Sig   doxycycline  (VIBRA -TABS) 100 MG tablet Take 1 tablet (100 mg total) by mouth 2 (two) times daily for 14 days.   Multiple Vitamins-Minerals (MULTIVITAMIN WITH MINERALS) tablet Take 1 tablet by mouth daily.   azithromycin  (ZITHROMAX  Z-PAK) 250 MG tablet As directed (Patient not taking: Reported on 04/03/2024)   rizatriptan  (MAXALT ) 10 MG tablet Take 1 tablet (10 mg total) by mouth as needed for migraine. May repeat in 2 hours if needed (Patient not taking: Reported on 04/03/2024)   No facility-administered encounter medications on file as of 04/03/2024.     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   Review of Systems 12 point ros except for extreme fatigue and arthralgias Physical Exam   BP 132/87   Pulse 80   Temp 98.5 F (36.9 C) (Oral)   Resp 16   SpO2 97%    Physical Exam  Constitutional: He is oriented to person, place, and time. He appears well-developed and well-nourished. No distress.  HENT:  Mouth/Throat: Oropharynx is clear and moist. No oropharyngeal exudate.  Cardiovascular: Normal rate,  regular rhythm and normal heart sounds. Exam reveals no gallop and no friction rub.  No murmur heard.  Pulmonary/Chest: Effort normal and breath sounds normal. No respiratory distress. He has no wheezes.  Lymphadenopathy:  Shotty anterior cervical adenopathy on left > right. Neurological: He is alert and oriented to person, place,  and time.  Skin: Skin is warm and dry. No rash noted. No erythema.  Psychiatric: He has a normal mood and affect. His behavior is normal.   LABS RBC: decreased (03/14/2024) Vitamin D: deficient EBV: elevated Mycoplasma: elevated Liver function tests: within normal limits CBC Lab Results  Component Value Date   WBC 4.8 04/01/2024   RBC 4.21 04/01/2024   HGB 13.3 04/01/2024   HCT 40.4 04/01/2024   PLT 215 04/01/2024   MCV 96 04/01/2024   MCH 31.6 04/01/2024   MCHC 32.9 04/01/2024   RDW 12.2 04/01/2024   LYMPHSABS 1.8 04/01/2024   MONOABS 0.8 08/25/2015   EOSABS 0.2 04/01/2024    BMET Lab Results  Component Value Date   NA 143 04/01/2024   K 4.3 04/01/2024   CL 104 04/01/2024   CO2 23 04/01/2024   GLUCOSE 96 04/01/2024   BUN 15 04/01/2024   CREATININE 0.95 04/01/2024   CALCIUM 9.9 04/01/2024   GFRNONAA 98 09/07/2020   GFRAA 114 09/07/2020    Assessment and Plan  Fatigue and lymphadenopathy  Persistent fatigue and lymphadenopathy since early August.  Symptoms include fatigue, joint pain, abdominal pain, and lymphadenopathy in the neck, armpits, and knees. Initial workup including blood tests and x-rays unremarkable. Differential diagnosis includes Epstein-Barr virus (EBV) infection, cytomegalovirus (CMV), and other viral infections. Doxycycline  initiated with some improvement in energy levels but persistent lymphadenopathy. - Order CT scan to evaluate lymphadenopathy and assess for potential biopsy. - Complete current course of doxycycline . - Do not start azithromycin  (Z-Pak). - Order blood work for cytomegalovirus (CMV). - Prescribe anti-nausea medication to manage doxycycline  side effects.  Possibly some sleep deprivation- sleeps about 6-8 hr. Will encourage more rest in the setting of fatigue. Denies anxiety/increased stress. - Encourage rest and adequate sleep during vacation.  I have personally spent 60 minutes involved in face-to-face and non-face-to-face  activities for this patient on the day of the visit. Professional time spent includes the following activities: Preparing to see the patient (review of tests), Obtaining and/or reviewing separately obtained history (admission/discharge record), Performing a medically appropriate examination and/or evaluation , Ordering medications/tests/procedures, referring and communicating with other health care professionals, Documenting clinical information in the EMR, Independently interpreting results (not separately reported), Communicating results to the patient/family/caregiver, Counseling and educating the patient/family/caregiver and Care coordination (not separately reported).

## 2024-04-04 LAB — CMP14+EGFR
ALT: 13 IU/L (ref 0–44)
AST: 17 IU/L (ref 0–40)
Albumin: 4.9 g/dL (ref 4.1–5.1)
Alkaline Phosphatase: 52 IU/L (ref 44–121)
BUN/Creatinine Ratio: 16 (ref 9–20)
BUN: 15 mg/dL (ref 6–20)
Bilirubin Total: 0.7 mg/dL (ref 0.0–1.2)
CO2: 23 mmol/L (ref 20–29)
Calcium: 9.9 mg/dL (ref 8.7–10.2)
Chloride: 104 mmol/L (ref 96–106)
Creatinine, Ser: 0.95 mg/dL (ref 0.76–1.27)
Globulin, Total: 2.1 g/dL (ref 1.5–4.5)
Glucose: 96 mg/dL (ref 70–99)
Potassium: 4.3 mmol/L (ref 3.5–5.2)
Sodium: 143 mmol/L (ref 134–144)
Total Protein: 7 g/dL (ref 6.0–8.5)
eGFR: 106 mL/min/1.73 (ref >59)

## 2024-04-04 LAB — CBC WITH DIFFERENTIAL/PLATELET
Basophils Absolute: 0.1 x10E3/uL (ref 0.0–0.2)
Basos: 2 %
EOS (ABSOLUTE): 0.2 x10E3/uL (ref 0.0–0.4)
Eos: 4 %
Hematocrit: 40.4 % (ref 37.5–51.0)
Hemoglobin: 13.3 g/dL (ref 13.0–17.7)
Immature Grans (Abs): 0 x10E3/uL (ref 0.0–0.1)
Immature Granulocytes: 0 %
Lymphocytes Absolute: 1.8 x10E3/uL (ref 0.7–3.1)
Lymphs: 37 %
MCH: 31.6 pg (ref 26.6–33.0)
MCHC: 32.9 g/dL (ref 31.5–35.7)
MCV: 96 fL (ref 79–97)
Monocytes Absolute: 0.6 x10E3/uL (ref 0.1–0.9)
Monocytes: 12 %
Neutrophils Absolute: 2.1 x10E3/uL (ref 1.4–7.0)
Neutrophils: 45 %
Platelets: 215 x10E3/uL (ref 150–450)
RBC: 4.21 x10E6/uL (ref 4.14–5.80)
RDW: 12.2 % (ref 11.6–15.4)
WBC: 4.8 x10E3/uL (ref 3.4–10.8)

## 2024-04-04 LAB — ANA,IFA RA DIAG PNL W/RFLX TIT/PATN
Cyclic Citrullin Peptide Ab: 5 U (ref 0–19)
Rheumatoid fact SerPl-aCnc: <10 [IU]/mL (ref <14.0)

## 2024-04-04 LAB — THYROID PANEL WITH TSH
Free Thyroxine Index: 2.2 (ref 1.2–4.9)
T3 Uptake Ratio: 29 (ref 24–39)
T4, Total: 7.6 ug/dL (ref 4.5–12.0)
TSH: 1.42 u[IU]/mL (ref 0.450–4.500)

## 2024-04-04 LAB — EBV VCA/EA AB, IGG
EBV Early Antigen Ab, IgG: 10.6 U/mL — AB (ref 0.0–8.9)
EBV VCA IgG: >600 U/mL — AB (ref 0.0–17.9)

## 2024-04-05 LAB — CMV ABS, IGG+IGM (CYTOMEGALOVIRUS)
CMV IgM: <30 [AU]/ml
Cytomegalovirus Ab-IgG: <0.6 U/mL

## 2024-04-05 LAB — EPSTEIN BARR VIRUS DNA, QUANT RTPCR
EBV DNA, QN PCR: NOT DETECTED [IU]/mL
EBV DNA, QN PCR: NOT DETECTED {Log_IU}/mL

## 2024-04-05 LAB — CMV (CYTOMEGALOVIRUS) DNA ULTRAQUANT, PCR
CMV DNA, QN PCR: NOT DETECTED [IU]/mL
CMV DNA, QN PCR: NOT DETECTED {Log_IU}/mL

## 2024-04-08 ENCOUNTER — Ambulatory Visit
Admission: RE | Admit: 2024-04-08 | Discharge: 2024-04-08 | Disposition: A | Discharge status: Home / Self Care | Source: Ambulatory Visit | Attending: Internal Medicine | Admitting: Internal Medicine

## 2024-04-08 DIAGNOSIS — R590 Localized enlarged lymph nodes: Secondary | ICD-10-CM

## 2024-04-08 MED ORDER — IOPAMIDOL (ISOVUE-300) INJECTION 61%
75.0000 mL | Freq: Once | INTRAVENOUS | Status: AC | PRN
  Administered 2024-04-08: 75 mL via INTRAVENOUS

## 2024-04-16 ENCOUNTER — Ambulatory Visit: Admitting: Infectious Disease

## 2024-04-22 NOTE — Progress Notes (Unsigned)
 Subjective:  Chief Complaint: fatigue and lymphadenopathy, myalgias    Patient ID: Nicholas Orozco, male    DOB: 04-Sep-1985, 38 y.o.   MRN: [id number]   Nicholas Orozco is a 38 year old man who had developed symptoms of fatigue joint pain and cervical lymphadenopathy after a trip to [place]  in mid July with his wife.  He did have a sinus congestion at the time and then upon returning home and significant fatigue joint pain arthralgias.  He is seen in the ER due to severe leg pain and weakness of doxycycline  which he found did have some improvement in his symptoms.  He had fairly extensive workup from primary care physician for various conditions including alpha gal, mono screening rheumatological labs.  He was then seen by my partner Dr. Juli and had additional testing including Lyme serologies spotted fever serologies which were negative Epstein bar virus serologies which were positive for viral capsid IgG consistent with past infection he then did tell me today he believes he had mono in his 20s if not the mono could potentially have explained his symptoms if he developed it in July he DNV serologies were negative and EBV and CMV PCR's in the blood that were negative.  He also had CT of the neck which was unremarkable.   His symptoms seem to be improving versus last appointment and I think they are going to resolve completely soon.  Out of thoroughness I will check him for HIV since certainly mono like illness can presenting clinical condition for acute HIV infection.  He is in a monogamous relationship at least in his standpoint with his wife.    Past Medical History:  Diagnosis Date   Allergy    Chest pain    Eczema    Heart murmur    diagnosed as an infant, saw pediatric cardiologist until he was 38 years old   Seasonal allergies     Past Surgical History:  Procedure Laterality Date   HAND SURGERY     HYDROCELE EXCISION / REPAIR     WISDOM TOOTH EXTRACTION      Family History   Problem Relation Age of Onset   Hypertension Mother    Depression Mother    Cancer Mother        Appendix surgery   Anxiety disorder Mother    Depression Sister    Anxiety disorder Sister    Autoimmune disease Sister    Asthma Sister    Depression Sister    Anxiety disorder Sister    Asthma Sister    Cancer Paternal Grandmother        lymphoma   Cancer Paternal Grandfather        intestinal cancer   Allergic rhinitis Neg Hx    Angioedema Neg Hx    Eczema Neg Hx    Immunodeficiency Neg Hx    Urticaria Neg Hx       Social History   Socioeconomic History   Marital status: Married    Spouse name: Autumn   Number of children: 3   Years of education: 14   Highest education level: Associate degree: occupational, Scientist, product/process development, or vocational program  Occupational History   Not on file  Tobacco Use   Smoking status: Never   Smokeless tobacco: Former    Quit date: 2017  Vaping Use   Vaping status: Never Used  Substance and Sexual Activity   Alcohol use: Yes    Alcohol/week: 6.0 standard drinks of alcohol    Types: 6  Cans of beer per week   Drug use: No   Sexual activity: Yes  Other Topics Concern   Not on file  Social History Narrative   Not on file   Social Drivers of Health   Financial Resource Strain: Low Risk  (01/10/2024)   Overall Financial Resource Strain (CARDIA)    Difficulty of Paying Living Expenses: Not very hard  Food Insecurity: No Food Insecurity (01/10/2024)   Hunger Vital Sign    Worried About Running Out of Food in the Last Year: Never true    Ran Out of Food in the Last Year: Never true  Transportation Needs: No Transportation Needs (01/10/2024)   PRAPARE - Administrator, Civil Service (Medical): No    Lack of Transportation (Non-Medical): No  Physical Activity: Sufficiently Active (01/10/2024)   Exercise Vital Sign    Days of Exercise per Week: 5 days    Minutes of Exercise per Session: 60 min  Stress: Stress Concern Present  (01/10/2024)   [employer] of Occupational Health - Occupational Stress Questionnaire    Feeling of Stress: Rather much  Social Connections: Socially Integrated (01/10/2024)   Social Connection and Isolation Panel    Frequency of Communication with Friends and Family: More than three times a week    Frequency of Social Gatherings with Friends and Family: Twice a week    Attends Religious Services: More than 4 times per year    Active Member of [employer] or Organizations: Yes    Attends Engineer, structural: More than 4 times per year    Marital Status: Married    No Known Allergies   Current Outpatient Medications:    azithromycin  (ZITHROMAX  Z-PAK) 250 MG tablet, As directed (Patient not taking: Reported on 04/03/2024), Disp: 6 tablet, Rfl: 0   Multiple Vitamins-Minerals (MULTIVITAMIN WITH MINERALS) tablet, Take 1 tablet by mouth daily., Disp: , Rfl:    ondansetron  (ZOFRAN ) 4 MG tablet, Take 1 tablet (4 mg total) by mouth every 8 (eight) hours as needed for nausea or vomiting., Disp: 20 tablet, Rfl: 0   rizatriptan  (MAXALT ) 10 MG tablet, Take 1 tablet (10 mg total) by mouth as needed for migraine. May repeat in 2 hours if needed (Patient not taking: Reported on 04/03/2024), Disp: 10 tablet, Rfl: 11   Review of Systems  Constitutional:  Positive for fatigue. Negative for activity change, appetite change, chills, diaphoresis, fever and unexpected weight change.  HENT:  Negative for congestion, rhinorrhea, sinus pressure, sneezing, sore throat and trouble swallowing.   Eyes:  Negative for photophobia and visual disturbance.  Respiratory:  Negative for cough, chest tightness, shortness of breath, wheezing and stridor.   Cardiovascular:  Negative for chest pain, palpitations and leg swelling.  Gastrointestinal:  Negative for abdominal distention, abdominal pain, anal bleeding, blood in stool, constipation, diarrhea, nausea and vomiting.  Genitourinary:  Negative for difficulty  urinating, dysuria, flank pain and hematuria.  Musculoskeletal:  Positive for arthralgias. Negative for back pain, gait problem and myalgias.  Skin:  Negative for color change, pallor, rash and wound.  Neurological:  Negative for dizziness, tremors, weakness and light-headedness.  Hematological:  Negative for adenopathy. Does not bruise/bleed easily.  Psychiatric/Behavioral:  Negative for agitation, behavioral problems, confusion, decreased concentration, dysphoric mood and sleep disturbance.        Objective:   Physical Exam Constitutional:      Appearance: He is well-developed.  HENT:     Head: Normocephalic and atraumatic.  Eyes:  Conjunctiva/sclera: Conjunctivae normal.  Cardiovascular:     Rate and Rhythm: Normal rate and regular rhythm.  Pulmonary:     Effort: Pulmonary effort is normal. No respiratory distress.     Breath sounds: No wheezing.  Abdominal:     General: There is no distension.     Palpations: Abdomen is soft.  Musculoskeletal:        General: No tenderness. Normal range of motion.     Cervical back: Normal range of motion and neck supple.  Lymphadenopathy:     Cervical: Cervical adenopathy present.     Right cervical: Superficial cervical adenopathy present.     Left cervical: Superficial cervical adenopathy present.  Skin:    General: Skin is warm and dry.     Coloration: Skin is not pale.     Findings: No erythema or rash.  Neurological:     General: No focal deficit present.     Mental Status: He is alert and oriented to person, place, and time.  Psychiatric:        Mood and Affect: Mood normal.        Behavior: Behavior normal.        Thought Content: Thought content normal.        Judgment: Judgment normal.           Assessment & Plan:   Assessment and Plan Assessment & Plan  Malaise, fatigue, lymphadenopathy arthralgias:  He has had a fairly extensive workup we will add an HIV antibody and HIV RNA test today.  Also check a  syphilis test though his symptoms are not consistent with syphilis it is certainly an organism that can present in a myriad of unusual ways.  Also check hepatitis but B serologies though he does not appear to have risk factors for this.  Screening for hepatitis C and hep A we will check these labs as well.  Additional STI testing all also check for gonorrhea and chlamydia just to make sure we give him a clean bill of health from a STI standpoint.  I have made an appointment for follow-up with Dr. Luiz in a month.

## 2024-04-23 ENCOUNTER — Ambulatory Visit (INDEPENDENT_AMBULATORY_CARE_PROVIDER_SITE_OTHER): Admitting: Infectious Disease

## 2024-04-23 ENCOUNTER — Other Ambulatory Visit (HOSPITAL_COMMUNITY)
Admission: RE | Admit: 2024-04-23 | Discharge: 2024-04-23 | Disposition: A | Discharge status: Home / Self Care | Source: Ambulatory Visit | Attending: Infectious Disease | Admitting: Infectious Disease

## 2024-04-23 ENCOUNTER — Encounter: Payer: Self-pay | Admitting: Infectious Disease

## 2024-04-23 ENCOUNTER — Other Ambulatory Visit: Payer: Self-pay

## 2024-04-23 VITALS — BP 137/88 | HR 65 | Temp 97.7°F | Ht 70.0 in | Wt 167.0 lb

## 2024-04-23 DIAGNOSIS — M791 Myalgia, unspecified site: Secondary | ICD-10-CM

## 2024-04-23 DIAGNOSIS — R5383 Other fatigue: Secondary | ICD-10-CM | POA: Insufficient documentation

## 2024-04-23 DIAGNOSIS — R599 Enlarged lymph nodes, unspecified: Secondary | ICD-10-CM | POA: Diagnosis not present

## 2024-04-23 DIAGNOSIS — Z1159 Encounter for screening for other viral diseases: Secondary | ICD-10-CM | POA: Diagnosis not present

## 2024-04-24 LAB — URINE CYTOLOGY ANCILLARY ONLY
Chlamydia: NEGATIVE
Comment: NEGATIVE
Comment: NORMAL
Neisseria Gonorrhea: NEGATIVE

## 2024-04-26 LAB — C-REACTIVE PROTEIN: CRP: <3 mg/L

## 2024-04-26 LAB — HIV ANTIBODY (ROUTINE TESTING W REFLEX)
HIV 1&2 Ab, 4th Generation: NONREACTIVE
HIV FINAL INTERPRETATION: NEGATIVE

## 2024-04-26 LAB — HEPATITIS A ANTIBODY, TOTAL: Hepatitis A AB,Total: NONREACTIVE

## 2024-04-26 LAB — RPR: RPR Ser Ql: NONREACTIVE

## 2024-04-26 LAB — HIV-1 RNA QUANT-NO REFLEX-BLD
HIV 1 RNA Quant: NOT DETECTED {copies}/mL
HIV-1 RNA Quant, Log: NOT DETECTED {Log_copies}/mL

## 2024-04-26 LAB — SEDIMENTATION RATE: Sed Rate: 9 mm/h (ref 0–15)

## 2024-04-26 LAB — HEPATITIS B SURFACE ANTIBODY, QUANTITATIVE: Hep B S AB Quant (Post): 311 m[IU]/mL (ref >=10)

## 2024-04-26 LAB — HEPATITIS C AB W/RFL RNA, PCR + GENO: Hepatitis C Ab: NONREACTIVE

## 2024-04-26 LAB — HEPATITIS B SURFACE ANTIGEN: Hepatitis B Surface Ag: NONREACTIVE

## 2024-05-20 ENCOUNTER — Ambulatory Visit: Admitting: Internal Medicine

## 2024-08-13 ENCOUNTER — Emergency Department (HOSPITAL_BASED_OUTPATIENT_CLINIC_OR_DEPARTMENT_OTHER)
Admission: EM | Admit: 2024-08-13 | Discharge: 2024-08-13 | Disposition: A | Discharge status: Home / Self Care | Attending: Emergency Medicine | Admitting: Emergency Medicine

## 2024-08-13 ENCOUNTER — Emergency Department (HOSPITAL_BASED_OUTPATIENT_CLINIC_OR_DEPARTMENT_OTHER): Admitting: Radiology

## 2024-08-13 ENCOUNTER — Other Ambulatory Visit: Payer: Self-pay

## 2024-08-13 ENCOUNTER — Encounter (HOSPITAL_BASED_OUTPATIENT_CLINIC_OR_DEPARTMENT_OTHER): Payer: Self-pay

## 2024-08-13 DIAGNOSIS — R079 Chest pain, unspecified: Secondary | ICD-10-CM | POA: Diagnosis present

## 2024-08-13 LAB — BASIC METABOLIC PANEL WITH GFR
Anion gap: 13 (ref 5–15)
BUN: 10 mg/dL (ref 6–20)
CO2: 25 mmol/L (ref 22–32)
Calcium: 9.5 mg/dL (ref 8.9–10.3)
Chloride: 103 mmol/L (ref 98–111)
Creatinine, Ser: 0.94 mg/dL (ref 0.61–1.24)
GFR, Estimated: >60 mL/min
Glucose, Bld: 96 mg/dL (ref 70–99)
Potassium: 3.3 mmol/L — ABNORMAL LOW (ref 3.5–5.1)
Sodium: 141 mmol/L (ref 135–145)

## 2024-08-13 LAB — CBC
HCT: 43.4 % (ref 39.0–52.0)
Hemoglobin: 15.5 g/dL (ref 13.0–17.0)
MCH: 32.3 pg (ref 26.0–34.0)
MCHC: 35.7 g/dL (ref 30.0–36.0)
MCV: 90.4 fL (ref 80.0–100.0)
Platelets: 231 K/uL (ref 150–400)
RBC: 4.8 MIL/uL (ref 4.22–5.81)
RDW: 11.8 % (ref 11.5–15.5)
WBC: 6.6 K/uL (ref 4.0–10.5)
nRBC: 0 % (ref 0.0–0.2)

## 2024-08-13 LAB — TROPONIN T, HIGH SENSITIVITY
Troponin T High Sensitivity: <15 ng/L (ref 0–19)
Troponin T High Sensitivity: <15 ng/L (ref 0–19)

## 2024-08-13 LAB — D-DIMER, QUANTITATIVE: D-Dimer, Quant: <0.27 ug{FEU}/mL (ref 0.00–0.50)

## 2024-08-13 MED ORDER — POTASSIUM CHLORIDE CRYS ER 20 MEQ PO TBCR
20.0000 meq | EXTENDED_RELEASE_TABLET | Freq: Once | ORAL | Status: AC
  Administered 2024-08-13: 20 meq via ORAL
  Filled 2024-08-13: qty 1

## 2024-08-13 NOTE — ED Provider Notes (Signed)
 " [place] EMERGENCY DEPARTMENT AT [facility] Provider Note   CSN: [id number] Arrival date & time: 08/13/24  1653     Patient presents with: Chest Pain   Nicholas Orozco is a 39 y.o. male.  39 year old male presents emergency department with complaints of chest pain intermittent in nature for the last 2 weeks approximately.  He reports the pain is near his left inferior nipple line radiating down to his left axillary line.  Patient reports since a 4 out of 10 to 7 out of 10 pain intermittently with no obvious exacerbating factors.  It is not reproducible with movement or palpation.  And he has taken ibuprofen with some relief.  He denies any associated symptoms with this pain.  Patient does not have any shortness of breath, diaphoresis, nausea, vomiting, dizziness, syncope.  Patient reports at the end of last year he began getting fatigued very easily and had extensive workups with numerous specialists and it was found that he had high walking pneumonia antibodies and mono in the past.  He began taking TRT and reported feeling much better from fatigue.  He reports he has been on it for approximately 2-1/2 months.  He is not sure if that is related to the current pain but he was concern for the persistent chest pain and wanted to be evaluated.     Prior to Admission medications  Not on File    Allergies: Patient has no known allergies.    Review of Systems  Cardiovascular:  Positive for chest pain.  All other systems reviewed and are negative.   Updated Vital Signs BP (!) 129/93 (BP Location: Right Arm)   Pulse 71   Temp 98.6 F (37 C) (Oral)   Resp 18   Ht 5' 10 (1.778 m)   Wt 74.8 kg   SpO2 100%   BMI 23.68 kg/m   Physical Exam Vitals and nursing note reviewed.  Constitutional:      General: He is not in acute distress.    Appearance: Normal appearance. He is well-developed. He is not ill-appearing.  HENT:     Head: Normocephalic and atraumatic.     Nose:  Nose normal.  Eyes:     Extraocular Movements: Extraocular movements intact.     Conjunctiva/sclera: Conjunctivae normal.     Pupils: Pupils are equal, round, and reactive to light.  Cardiovascular:     Rate and Rhythm: Normal rate and regular rhythm.     Heart sounds: Normal heart sounds.  Pulmonary:     Effort: Pulmonary effort is normal. No respiratory distress.     Breath sounds: Normal breath sounds.  Chest:     Chest wall: No tenderness.     Comments: No reproducible pain to palpation or range of motion of shoulder. Abdominal:     Palpations: Abdomen is soft.     Tenderness: There is no abdominal tenderness. There is no guarding.  Musculoskeletal:        General: Normal range of motion.     Cervical back: Normal range of motion.     Comments: No lower extremity edema noted.  No pain to palpation throughout the neck and no pain with neck range of motion.  Skin:    General: Skin is warm.     Capillary Refill: Capillary refill takes less than 2 seconds.  Neurological:     General: No focal deficit present.     Mental Status: He is alert.  Psychiatric:  Mood and Affect: Mood normal.        Behavior: Behavior normal.     (all labs ordered are listed, but only abnormal results are displayed) Labs Reviewed  BASIC METABOLIC PANEL WITH GFR - Abnormal; Notable for the following components:      Result Value   Potassium 3.3 (*)    All other components within normal limits  CBC  D-DIMER, QUANTITATIVE  TROPONIN T, HIGH SENSITIVITY  TROPONIN T, HIGH SENSITIVITY    EKG: None  Radiology: DG Chest 2 View Result Date: 08/13/2024 EXAM: 2 VIEW(S) XRAY OF THE CHEST 08/13/2024 05:32:00 PM COMPARISON: Chest x-ray 10/15/2012. CLINICAL HISTORY: chest pain FINDINGS: LUNGS AND PLEURA: No focal pulmonary opacity. No pleural effusion. No pneumothorax. HEART AND MEDIASTINUM: No acute abnormality of the cardiac and mediastinal silhouettes. BONES AND SOFT TISSUES: No acute osseous  abnormality. IMPRESSION: 1. No acute cardiopulmonary pathology. Electronically signed by: Morgane Naveau MD 08/13/2024 05:38 PM EST RP Workstation: [id number]     Procedures   Medications Ordered in the ED  potassium chloride  SA (KLOR-CON  M) CR tablet 20 mEq (20 mEq Oral Given 08/13/24 2044)    39 y.o. male presents to the ED with complaints of intermittent chest pain for 2 weeks approximately, The differential diagnosis includes ACS, PE, pneumonia, pneumothorax, musculoskeletal pain, musculoskeletal injury, electrolyte abnormality, anemia.  (Ddx)  On arrival pt is nontoxic, vitals unremarkable. Exam is unremarkable in ED  Lab Tests:  BMP CBC troponin ordered in triage.  Remarkable for mild hypokalemia 3.1. D-dimer was   Imaging Studies ordered:  I ordered imaging studies which included chest x-ray, no abnormal findings.  ED Course:   Patient is sitting comfortably in ED bed in no acute distress nontoxic-appearing on initial evaluation.  Patient denies any current pain.  Patient has recently been worked up extensively due to chronic fatigue.  He was recently placed on TRT and has been on it for approximately 3 months.  D-dimer will be collected on repeat troponin.  This pain did not start with any specific movement.  And it is atypical for ACS.  Patient denies any traumatic events.  Patient does not have any clinical symptoms of PE but recent TRT and intermittent chest pain D-dimer will be ordered to rule out.  D-dimer was negative.  Delta troponin was negative.  Patient will be given potassium for mild hypokalemia.  Patient was advised of findings at bedside.  Patient reports he has seen a cardiologist in the past because he had a heart murmur as a child.  It was recommended that patient follow-up with cardiology to ensure no further workup needed to be done at this time.  Patient agreed to follow-up with cardiology.  Patient was given strict return precautions.  Patient agreed to treatment  plan and was comfortable discharge at this time.  Portions of this note were generated with Scientist, clinical (histocompatibility and immunogenetics). Dictation errors may occur despite best attempts at proofreading.   Final diagnoses:  Chest pain, unspecified type    ED Discharge Orders          Ordered    Ambulatory referral to Cardiology       Comments: If you have not heard from the Cardiology office within the next 72 hours please call [phone].   08/13/24 2043               Myriam Fonda RAMAN, PA-C 08/14/24 0023    Pamella Ozell LABOR, DO 08/19/24 2341  "

## 2024-08-13 NOTE — ED Triage Notes (Signed)
 Interment pain left chest and side radiating to left shoulder.  Pain to left side abdominal area also.  Pain off and on for a couple of weeks.

## 2024-08-13 NOTE — Discharge Instructions (Signed)
 Imaging lab work and exam are reassuring today.  I placed a referral for cardiology if you do not hear back from them in the next 24 hours please give them a call to establish an appointment.  Monitor for any concerning new or worsening symptoms such as persistent chest pain, dizziness, fainting, or shortness of breath.  If any of these or other concerning symptoms arise please return to emergency department for further evaluation.

## 2024-08-15 ENCOUNTER — Telehealth: Payer: Self-pay

## 2024-08-15 NOTE — Telephone Encounter (Signed)
 Appt scheduled for 08/18/2024

## 2024-08-15 NOTE — Telephone Encounter (Signed)
 Copied from CRM [id number]. Topic: Appointments - Scheduling Inquiry for Clinic >> Aug 15, 2024 11:07 AM Debby BROCKS wrote: Reason for CRM: Patient went to the ER on 08/14/2023 due to side pain. They checked his chest and heart and patient had no issues but the pain is still lingering. No 2 week availability for hospital follow up. Patient has no preference, just earliest available

## 2024-08-18 ENCOUNTER — Encounter: Payer: Self-pay | Admitting: Nurse Practitioner

## 2024-08-18 ENCOUNTER — Ambulatory Visit (INDEPENDENT_AMBULATORY_CARE_PROVIDER_SITE_OTHER): Admitting: Nurse Practitioner

## 2024-08-18 VITALS — BP 122/79 | HR 63 | Temp 97.7°F | Ht 70.0 in | Wt 170.0 lb

## 2024-08-18 DIAGNOSIS — R1012 Left upper quadrant pain: Secondary | ICD-10-CM | POA: Diagnosis not present

## 2024-08-18 NOTE — Progress Notes (Signed)
 "  Subjective:    Patient ID: Nicholas Orozco, male    DOB: June 07, 1986, 39 y.o.   MRN: [id number]   Chief Complaint: hospital follow up   HPI Patient presented to ED on 08/13/24 with chest pain intermittently for 2 weeks. The ED record stated: He reports the pain is near his left inferior nipple line radiating down to his left axillary line. Patient reports since a 4 out of 10 to 7 out of 10 pain intermittently with no obvious exacerbating factors. It is not reproducible with movement or palpation. And he has taken ibuprofen with some relief. He denies any associated symptoms with this pain. Patient does not have any shortness of breath, diaphoresis, nausea, vomiting, dizziness, syncope.  He was found to have mild hypokalemia and was given potassium in the ED. He was told to follow up with cardiology to make sure no further testing needed to be done. Referral was made by the ED. Patient declined referral, wanted  to see his PCP first. Patient says that he was not c/o chest pain when he went to the ED. He was c/o of left flank pain. Pain started 3 weeks. Ago describes pain as a pulsating intermittent pain. Located in LUQ of abdomen. No relation to bowl movements. Denies constipation. Rates pain 2-7/10 when he has it. 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  Eyes:  Negative for pain.  Respiratory:  Negative for shortness of breath.   Cardiovascular:  Negative for chest pain, palpitations and leg swelling.   Gastrointestinal:  Negative for abdominal pain.  Endocrine: Negative for polydipsia.  Skin:  Negative for rash.  Neurological:  Negative for dizziness, weakness and headaches.  Hematological:  Does not bruise/bleed easily.  All other systems reviewed and are negative.      Objective:   Physical Exam Constitutional:      Appearance: Normal appearance.  Cardiovascular:     Rate and Rhythm: Normal rate and regular rhythm.     Heart sounds: Normal heart sounds.  Pulmonary:     Effort: Pulmonary effort is normal.     Breath sounds: Normal breath sounds.  Abdominal:     General: Abdomen is flat. Bowel sounds are normal.     Tenderness: There is no abdominal tenderness. There is right CVA tenderness and left CVA tenderness. There is no guarding or rebound.  Skin:    General: Skin is warm.  Neurological:     General: No focal deficit present.     Mental Status: He is alert and oriented to person, place, and time.  Psychiatric:        Mood and Affect: Mood normal.        Behavior: Behavior normal.    BP 122/79   Pulse 63   Temp 97.7 F (36.5 C) (Temporal)   Ht 5' 10 (1.778 m)   Wt 170 lb (77.1 kg)   SpO2 96%   BMI 24.39 kg/m         Assessment & Plan:   Nicholas Haring  Orozco in today with chief complaint of Hospitalization Follow-up (Left side pain/)   1. Left upper quadrant pain (Primary) Watch diet- avoid foods with smalls seeds or undigestable skin Keep diary of painful epidodes- when occur, how bad it os and how long it lasts. Will talk once results are back - CT ABDOMEN PELVIS W CONTRAST; Future    The above assessment and management plan was discussed with the patient. The patient verbalized understanding of and has agreed to the management plan. Patient is aware to call the clinic if symptoms persist or worsen. Patient is aware when to return to the clinic for a follow-up visit. Patient educated on when it is appropriate to go to the emergency department.    Mary-Margaret Gladis, FNP   "

## 2024-08-18 NOTE — Patient Instructions (Signed)
Diverticulosis  Diverticulosis is when small pouches called diverticula form in the wall of the colon. The colon is where water is absorbed. It is also where poop (stool) is formed. The pouches form when the inside layer of the colon pushes through weak spots in the outer layers of the colon. You may have a few pouches or many of them. In most cases, the pouches do not cause problems. If they become inflamed or infected, you may have a condition called diverticulitis. What are the causes? The cause of this condition is not known. What increases the risk? You are more likely to get this condition if: You are older than 38 years of age. You do not eat enough fiber or you get constipated a lot. You are overweight. You do not get enough exercise. You smoke. You take over-the-counter pain medicines. You have a family history of the condition. What are the signs or symptoms? In most people, there are no symptoms. If you do have symptoms, they may include: Bloating. Stomach cramps. Constipation or diarrhea. Pain in the lower left side of your abdomen. How is this diagnosed? This condition is often diagnosed during an exam for other colon problems. It may be diagnosed when you have: A colonoscopy. This is when a tube with a camera on the end is used to look at your colon. A barium enema. This is an X-ray exam that uses dye to look at your colon. A CT scan. How is this treated? You may not need treatment. Your health care provider will tell you what you can do at home to help prevent problems. You may need treatment if you have symptoms or if you have had diverticulitis before. You may be told to: Eat a high-fiber diet. Take medicine to relax your colon. Lose weight. Follow these instructions at home: Medicines Take over-the-counter and prescription medicines only as told by your provider. If told, take a fiber supplement or probiotic. Managing constipation Your condition may cause  constipation. To prevent or treat constipation, you may need to: Drink enough fluid to keep your pee (urine) pale yellow. Take over-the-counter or prescription medicines. Eat foods that are high in fiber, such as beans, whole grains, and fresh fruits and vegetables. Limit foods that are high in fat and processed sugars, such as fried or sweet foods. Try not to strain when you poop. Contact a health care provider if: Your symptoms get worse all of a sudden. You have pain in your abdomen that gets worse. You have bloating or stomach cramps. You continue to have frequent constipation. You have a fever or chills. You vomit. Your poop is bloody, black, or tarry. This information is not intended to replace advice given to you by your health care provider. Make sure you discuss any questions you have with your health care provider. Document Revised: 04/13/2022 Document Reviewed: 04/13/2022 Elsevier Patient Education  2024 Elsevier Inc.  

## 2024-09-01 ENCOUNTER — Ambulatory Visit (HOSPITAL_COMMUNITY)
Admission: RE | Admit: 2024-09-01 | Discharge: 2024-09-01 | Disposition: A | Discharge status: Home / Self Care | Source: Ambulatory Visit | Attending: Nurse Practitioner | Admitting: Nurse Practitioner

## 2024-09-01 DIAGNOSIS — R1012 Left upper quadrant pain: Secondary | ICD-10-CM

## 2024-09-01 MED ORDER — IOHEXOL 9 MG/ML PO SOLN
ORAL | Status: AC
  Filled 2024-09-01: qty 1000

## 2024-09-01 MED ORDER — IOHEXOL 9 MG/ML PO SOLN
500.0000 mL | ORAL | Status: AC
  Administered 2024-09-01: 500 mL via ORAL

## 2024-09-01 MED ORDER — IOHEXOL 300 MG/ML  SOLN
100.0000 mL | Freq: Once | INTRAMUSCULAR | Status: AC | PRN
  Administered 2024-09-01: 100 mL via INTRAVENOUS

## 2024-09-03 ENCOUNTER — Ambulatory Visit: Payer: Self-pay | Admitting: Nurse Practitioner

## 2024-09-03 DIAGNOSIS — K352 Acute appendicitis with generalized peritonitis, without perforation or abscess: Secondary | ICD-10-CM

## 2024-09-03 MED ORDER — AMOXICILLIN-POT CLAVULANATE 875-125 MG PO TABS: 1.0000 | ORAL_TABLET | Freq: Two times a day (BID) | ORAL | 0 refills | Status: AC

## 2024-09-04 ENCOUNTER — Encounter: Payer: Self-pay | Admitting: General Surgery

## 2024-09-04 ENCOUNTER — Ambulatory Visit: Admitting: General Surgery

## 2024-09-04 VITALS — BP 123/83 | HR 68 | Temp 98.1°F | Resp 12 | Ht 70.0 in | Wt 172.0 lb

## 2024-09-04 DIAGNOSIS — K37 Unspecified appendicitis: Secondary | ICD-10-CM | POA: Insufficient documentation

## 2024-09-04 NOTE — Patient Instructions (Signed)
 Take the Augmentin  given the concern for early appendicitis on the CT scan. Try to avoid red meat to less inflammation of the colon.  Watch for any worsening right lower abdominal pain, nausea, vomiting, fevers.  Will plan to get your appendix out to ensure that this does not progress/ occur again and to ensure no other pathology going on with the appendix.

## 2024-09-04 NOTE — Progress Notes (Unsigned)
 [facility] History and Physical   Reason for Consult: Concern for early appendicitis on CT Referring Physician: Ronal Rollene Lunger, NP   Chief Complaint   New Patient (Initial Visit)     HPI: Nicholas Orozco is a 39 y.o. male with a CT with concern for early indeterminate appendicitis. He has been having this vague left upper quadrant pain that he describes as a tickle, pressure on the left upper quadrant that feels like a thumb holding pressure. He has been having this for a while and his PCP checked this with a CT scan. The CT shows a 8mm appendix that has some stranding possibly concerning for appendicitis. He denies any RLQ pain. He has been eating normal. He did have a weird fever a few weeks back. This resolved on without issues.   When Ronal Rollene contacted me I told her to send in some Augmentin . Of note his mother had appendiceal cancer and required surgery and subsequent HIPEC.   Past Medical History:  Diagnosis Date   Allergy    Chest pain    Eczema    Heart murmur    diagnosed as an infant, saw pediatric cardiologist until he was 39 years old   Seasonal allergies     Past Surgical History:  Procedure Laterality Date   HAND SURGERY     HYDROCELE EXCISION / REPAIR     WISDOM TOOTH EXTRACTION      Family History  Problem Relation Age of Onset   Hypertension Mother    Depression Mother    Cancer Mother        Appendix surgery   Anxiety disorder Mother    Depression Sister    Anxiety disorder Sister    Autoimmune disease Sister    Asthma Sister    Depression Sister    Anxiety disorder Sister    Asthma Sister    Cancer Paternal Grandmother        lymphoma   Cancer Paternal Grandfather        intestinal cancer   Allergic rhinitis Neg Hx    Angioedema Neg Hx    Eczema Neg Hx    Immunodeficiency Neg Hx    Urticaria Neg Hx     Social History[1]  Medications: I have reviewed the patient's current medications. Current Outpatient  Medications  Medication Sig Dispense Refill Last Dose/Taking   amoxicillin -clavulanate (AUGMENTIN ) 875-125 MG tablet Take 1 tablet by mouth 2 (two) times daily. 20 tablet 0 Taking   No current facility-administered medications for this visit.     Allergies[2]   ROS:  A comprehensive review of systems was negative except for: Gastrointestinal: positive for vague left upper quadrant pain  Blood pressure 123/83, pulse 68, temperature 98.1 F (36.7 C), temperature source Oral, resp. rate 12, height 5' 10 (1.778 m), weight 172 lb (78 kg), SpO2 97%. Physical Exam Vitals reviewed.  HENT:     Head: Normocephalic.     Nose: Nose normal.     Mouth/Throat:     Mouth: Mucous membranes are moist.  Eyes:     Extraocular Movements: Extraocular movements intact.  Cardiovascular:     Rate and Rhythm: Normal rate.  Pulmonary:     Effort: Pulmonary effort is normal.     Breath sounds: Normal breath sounds.  Abdominal:     General: There is no distension.     Palpations: Abdomen is soft.     Tenderness: There is abdominal tenderness.     Comments: Minor tenderness  in the LUQ and in the RLQ with deep palpation   Musculoskeletal:        General: Normal range of motion.     Cervical back: Normal range of motion.  Skin:    General: Skin is warm.  Neurological:     General: No focal deficit present.     Mental Status: He is alert.  Psychiatric:        Mood and Affect: Mood normal.     Results: CT ABDOMEN PELVIS W CONTRAST EXAM: CT ABDOMEN AND PELVIS WITH CONTRAST 09/01/2024 05:01:42 PM  TECHNIQUE: CT of the abdomen and pelvis was performed with the administration of 100 mL of iohexol  (OMNIPAQUE ) 300 MG/ML solution. Multiplanar reformatted images are provided for review. Automated exposure control, iterative reconstruction, and/or weight-based adjustment of the mA/kV was utilized to reduce the radiation dose to as low as reasonably achievable.  COMPARISON: CT abdomen and pelvis  01/29/2012.  CLINICAL HISTORY: LUQ abdominal pain. Left upper quadrant abdominal pain.  FINDINGS:  LOWER CHEST: No acute abnormality.  LIVER: Indeterminate hypodensity seen within the left lobe of the liver measuring 2.5 cm.  GALLBLADDER AND BILE DUCTS: Gallbladder is unremarkable. No biliary ductal dilatation.  SPLEEN: No acute abnormality.  PANCREAS: No acute abnormality.  ADRENAL GLANDS: No acute abnormality.  KIDNEYS, URETERS AND BLADDER: No stones in the kidneys or ureters. No hydronephrosis. No perinephric or periureteral stranding. Urinary bladder is unremarkable.  GI AND BOWEL: Stomach demonstrates no acute abnormality. The appendix is prominent in size in the central portion measuring up to 8 mm with mild surrounding inflammation, indeterminate. There is no bowel obstruction.  PERITONEUM AND RETROPERITONEUM: No ascites. No free air.  VASCULATURE: Aorta is normal in caliber.  LYMPH NODES: No lymphadenopathy.  REPRODUCTIVE ORGANS: No acute abnormality.  BONES AND SOFT TISSUES: No acute osseous abnormality. No focal soft tissue abnormality.  IMPRESSION: 1. Prominent appendix measuring up to 8 mm with mild surrounding inflammation, indeterminate for early appendicitis; consider surgical evaluation and short-interval follow-up as warranted. 2. Indeterminate 2.5 cm hypodense lesion in the left hepatic lobe; recommend further characterization with contrast-enhanced MRI liver protocol.  Electronically signed by: Greig Pique MD 09/03/2024 03:23 AM EST RP Workstation: [id number]  Personally reviewed the CT and showed the patient and his wife- appendix with minor stranding, some dilation in the mid appendix, nothing overly concerning  Area in the liver noted and I can almost see it on the renal CT from 2011, it is larger now than then   Assessment & Plan:  Nicholas Orozco is a 39 y.o. male with a vague left upper quadrant pain that I cannot explain by  the CT. Told him to monitor his diet and see if it is related to eating.  Discussed the potential for appendicitis and what the symptoms would be. He is going out of town for a week. Discussed doing the antibiotics. Discussed the non operative management in [place]. Discussed his mom's history. Discussed that ultimately given everything, we will plan for robotic appendectomy when he gets back and risk of bleeding, infection, negative appendectomy, finding something unexpected, open procedure, discussed looking in the left upper quadrant and making sure do not see anything else in the abdomen.   Take the Augmentin  given the concern for early appendicitis on the CT scan. Try to avoid red meat to less inflammation of the colon.  Watch for any worsening right lower abdominal pain, nausea, vomiting, fevers.  Will plan to get your appendix out to ensure that  this does not progress/ occur again and to ensure no other pathology going on with the appendix.   All questions were answered to the satisfaction of the patient and family.  PCP to order MRI in the future for the liver lesion to get a baseline for what that is. PCP aware.    Manuelita JAYSON Pander 09/04/2024, 10:54 AM           [1]  Social History Tobacco Use   Smoking status: Never   Smokeless tobacco: Former    Quit date: 2017  Vaping Use   Vaping status: Never Used  Substance Use Topics   Alcohol use: Yes    Alcohol/week: 6.0 standard drinks of alcohol    Types: 6 Cans of beer per week   Drug use: No  [2] No Known Allergies

## 2024-09-05 NOTE — H&P (Signed)
 [facility] History and Physical   Reason for Consult: Concern for early appendicitis on CT Referring Physician: Ronal Rollene Lunger, NP   Chief Complaint   New Patient (Initial Visit)     HPI: Nicholas Orozco is a 39 y.o. male with a CT with concern for early indeterminate appendicitis. He has been having this vague left upper quadrant pain that he describes as a tickle, pressure on the left upper quadrant that feels like a thumb holding pressure. He has been having this for a while and his PCP checked this with a CT scan. The CT shows a 8mm appendix that has some stranding possibly concerning for appendicitis. He denies any RLQ pain. He has been eating normal. He did have a weird fever a few weeks back. This resolved on without issues.   When Ronal Rollene contacted me I told her to send in some Augmentin . Of note his mother had appendiceal cancer and required surgery and subsequent HIPEC.   Past Medical History:  Diagnosis Date   Allergy    Chest pain    Eczema    Heart murmur    diagnosed as an infant, saw pediatric cardiologist until he was 39 years old   Seasonal allergies     Past Surgical History:  Procedure Laterality Date   HAND SURGERY     HYDROCELE EXCISION / REPAIR     WISDOM TOOTH EXTRACTION      Family History  Problem Relation Age of Onset   Hypertension Mother    Depression Mother    Cancer Mother        Appendix surgery   Anxiety disorder Mother    Depression Sister    Anxiety disorder Sister    Autoimmune disease Sister    Asthma Sister    Depression Sister    Anxiety disorder Sister    Asthma Sister    Cancer Paternal Grandmother        lymphoma   Cancer Paternal Grandfather        intestinal cancer   Allergic rhinitis Neg Hx    Angioedema Neg Hx    Eczema Neg Hx    Immunodeficiency Neg Hx    Urticaria Neg Hx     [Social History]  [Social History] Tobacco Use   Smoking status: Never   Smokeless tobacco: Former     Quit date: 2017  Vaping Use   Vaping status: Never Used  Substance Use Topics   Alcohol use: Yes    Alcohol/week: 6.0 standard drinks of alcohol    Types: 6 Cans of beer per week   Drug use: No    Medications: I have reviewed the patient's current medications. Current Outpatient Medications  Medication Sig Dispense Refill Last Dose/Taking   amoxicillin -clavulanate (AUGMENTIN ) 875-125 MG tablet Take 1 tablet by mouth 2 (two) times daily. 20 tablet 0 Taking   No current facility-administered medications for this visit.     [Allergies]  [Allergies] No Known Allergies    ROS:  A comprehensive review of systems was negative except for: Gastrointestinal: positive for vague left upper quadrant pain  Blood pressure 123/83, pulse 68, temperature 98.1 F (36.7 C), temperature source Oral, resp. rate 12, height 5' 10 (1.778 m), weight 172 lb (78 kg), SpO2 97%. Physical Exam Vitals reviewed.  HENT:     Head: Normocephalic.     Nose: Nose normal.     Mouth/Throat:     Mouth: Mucous membranes are moist.  Eyes:  Extraocular Movements: Extraocular movements intact.  Cardiovascular:     Rate and Rhythm: Normal rate.  Pulmonary:     Effort: Pulmonary effort is normal.     Breath sounds: Normal breath sounds.  Abdominal:     General: There is no distension.     Palpations: Abdomen is soft.     Tenderness: There is abdominal tenderness.     Comments: Minor tenderness in the LUQ and in the RLQ with deep palpation   Musculoskeletal:        General: Normal range of motion.     Cervical back: Normal range of motion.  Skin:    General: Skin is warm.  Neurological:     General: No focal deficit present.     Mental Status: He is alert.  Psychiatric:        Mood and Affect: Mood normal.     Results: CT ABDOMEN PELVIS W CONTRAST EXAM: CT ABDOMEN AND PELVIS WITH CONTRAST 09/01/2024 05:01:42 PM  TECHNIQUE: CT of the abdomen and pelvis was performed with the administration of  100 mL of iohexol  (OMNIPAQUE ) 300 MG/ML solution. Multiplanar reformatted images are provided for review. Automated exposure control, iterative reconstruction, and/or weight-based adjustment of the mA/kV was utilized to reduce the radiation dose to as low as reasonably achievable.  COMPARISON: CT abdomen and pelvis 01/29/2012.  CLINICAL HISTORY: LUQ abdominal pain. Left upper quadrant abdominal pain.  FINDINGS:  LOWER CHEST: No acute abnormality.  LIVER: Indeterminate hypodensity seen within the left lobe of the liver measuring 2.5 cm.  GALLBLADDER AND BILE DUCTS: Gallbladder is unremarkable. No biliary ductal dilatation.  SPLEEN: No acute abnormality.  PANCREAS: No acute abnormality.  ADRENAL GLANDS: No acute abnormality.  KIDNEYS, URETERS AND BLADDER: No stones in the kidneys or ureters. No hydronephrosis. No perinephric or periureteral stranding. Urinary bladder is unremarkable.  GI AND BOWEL: Stomach demonstrates no acute abnormality. The appendix is prominent in size in the central portion measuring up to 8 mm with mild surrounding inflammation, indeterminate. There is no bowel obstruction.  PERITONEUM AND RETROPERITONEUM: No ascites. No free air.  VASCULATURE: Aorta is normal in caliber.  LYMPH NODES: No lymphadenopathy.  REPRODUCTIVE ORGANS: No acute abnormality.  BONES AND SOFT TISSUES: No acute osseous abnormality. No focal soft tissue abnormality.  IMPRESSION: 1. Prominent appendix measuring up to 8 mm with mild surrounding inflammation, indeterminate for early appendicitis; consider surgical evaluation and short-interval follow-up as warranted. 2. Indeterminate 2.5 cm hypodense lesion in the left hepatic lobe; recommend further characterization with contrast-enhanced MRI liver protocol.  Electronically signed by: Greig Pique MD 09/03/2024 03:23 AM EST RP Workstation: [id number]  Personally reviewed the CT and showed the patient and his  wife- appendix with minor stranding, some dilation in the mid appendix, nothing overly concerning  Area in the liver noted and I can almost see it on the renal CT from 2011, it is larger now than then   Assessment & Plan:  [name] is a 39 y.o. male with a vague left upper quadrant pain that I cannot explain by the CT. Told him to monitor his diet and see if it is related to eating.  Discussed the potential for appendicitis and what the symptoms would be. He is going out of town for a week. Discussed doing the antibiotics. Discussed the non operative management in [place]. Discussed his mom's history. Discussed that ultimately given everything, we will plan for robotic appendectomy when he gets back and risk of bleeding, infection, negative appendectomy, finding  something unexpected, open procedure, discussed looking in the left upper quadrant and making sure do not see anything else in the abdomen.   Take the Augmentin  given the concern for early appendicitis on the CT scan. Try to avoid red meat to less inflammation of the colon.  Watch for any worsening right lower abdominal pain, nausea, vomiting, fevers.  Will plan to get your appendix out to ensure that this does not progress/ occur again and to ensure no other pathology going on with the appendix.   All questions were answered to the satisfaction of the patient and family.  PCP to order MRI in the future for the liver lesion to get a baseline for what that is. PCP aware.    Manuelita JAYSON Pander 09/04/2024, 10:54 AM

## 2024-09-08 ENCOUNTER — Ambulatory Visit (HOSPITAL_COMMUNITY)

## 2024-09-11 ENCOUNTER — Other Ambulatory Visit (HOSPITAL_COMMUNITY)

## 2024-09-16 ENCOUNTER — Other Ambulatory Visit (HOSPITAL_COMMUNITY)

## 2024-09-19 ENCOUNTER — Ambulatory Visit (HOSPITAL_COMMUNITY): Admit: 2024-09-19 | Admitting: General Surgery

## 2024-09-19 DIAGNOSIS — K37 Unspecified appendicitis: Secondary | ICD-10-CM | POA: Diagnosis present
